# Patient Record
Sex: Male | Born: 1935 | Race: White | Hispanic: No | Marital: Married | Smoking: Never smoker
Health system: Southern US, Community
[De-identification: ages and names within clinical notes are randomized; demographics above are authoritative.]

## PROBLEM LIST (undated history)

## (undated) DIAGNOSIS — I509 Heart failure, unspecified: Secondary | ICD-10-CM

## (undated) DIAGNOSIS — I4891 Unspecified atrial fibrillation: Secondary | ICD-10-CM

## (undated) DIAGNOSIS — E119 Type 2 diabetes mellitus without complications: Secondary | ICD-10-CM

## (undated) HISTORY — PX: KNEE ARTHROSCOPY: SUR90

---

## 2005-01-28 ENCOUNTER — Ambulatory Visit: Payer: Self-pay | Admitting: Internal Medicine

## 2005-01-28 ENCOUNTER — Ambulatory Visit (HOSPITAL_COMMUNITY): Admission: RE | Admit: 2005-01-28 | Discharge: 2005-01-28 | Payer: Self-pay | Admitting: Internal Medicine

## 2007-03-06 ENCOUNTER — Encounter: Admission: RE | Admit: 2007-03-06 | Discharge: 2007-03-06 | Payer: Self-pay | Admitting: *Deleted

## 2007-03-17 ENCOUNTER — Ambulatory Visit (HOSPITAL_COMMUNITY): Admission: RE | Admit: 2007-03-17 | Discharge: 2007-03-17 | Payer: Self-pay | Admitting: *Deleted

## 2010-09-15 NOTE — Cardiovascular Report (Signed)
Ross Sweeney, BACHTEL NO.:  [id number]   MEDICAL RECORD NO.:  [id number]          PATIENT TYPE:  OIB   LOCATION:  2852                         FACILITY:  [facility]   PHYSICIAN:  Darlin Priestly, MD  DATE OF BIRTH:  Oct 29, 1935   DATE OF PROCEDURE:  DATE OF DISCHARGE:                            CARDIAC CATHETERIZATION   PROCEDURES:  1. Left heart catheterization.  2. Coronary angiography.  3. Left ventriculogram.  4. Abdominal aortogram.   COMPLICATIONS:  None.   INDICATIONS:  Mr. Schaller is a 75 year old male patient of Dr. Delbert Harness  with a history of atrial fibrillation dating back to July 2006 as well  as moderate mitral regurgitation.  He did undergo a Cardiolite scan  suggesting inferolateral wall ischemia with an EF of 45%.  He has had no  significant chest pain.  After discussing with his wife, we have elected  to proceed with cardiac catheterization to rule out the possibility of  CAD given his underlying atrial fibrillation.   DESCRIPTION:  After informed consent, the patient was taken to the  cardiac cath lab.  The right groin was shaved, prepped and draped in the  sterile fashion.  ECG monitoring was established. Using modified  Seldinger, number 6 French arterial sheath  was inserted in the right  femoral artery.  A 6-French diagnostic catheter and performed diagnostic  angiography.   OPINION:  1. Left main is a large vessel with no significant disease.  2. LAD is a large vessel coursing through the apex giving rise to two      diagonal branch.  The LAD is coarse and irregular throughout its      proximal and early mid segment up to 40% stenosis.  3. The first diagonal is a medium-sized vessel with no significant      disease.  4. The second diagonal is a small vessel with no significant disease.  5. Left circumflex is a medium-size vessel coursing to the AV groove      and gives three obtuse marginal branches.  The AV circumflex has      40%  midvessel narrowing.  6. First OM is a mid vessel which bifurcates distally with a 40 %      midvessel lesion.  7. Second OM is a small vessel with 99% ostial lesion.  The third OM      is a small to medium size vessel with diffuse 60% narrowing      throughout.  8. The right coronary artery is a large vessel which is dominant and      gives rise to the PDA as well as the posterolateral branch.  There      is no significant disease in the RCA, PDA or posterolateral branch.  9. Left ventriculogram reveals a mildly depressed EF of 40-45% with      mild global hypokinesis.  There does appear to be moderate mitral      regurgitation, though  there is significant amount of ectopy noted.  10.Abdominal aortogram reveals no evidence of significant renal artery  stenosis or aneurysm.   HEMODYNAMIC SYSTEM:  Right arterial pressure 102/70, LV systemic  pressure 102/10, LVEDP of 22.   CONCLUSION:  1. Scattered small vessel disease best treated medically.  2. Mildly depressed left ventricular systolic function.  3. Moderate mitral regurgitation.  4. No evidence of renal artery stenosis.  5. Elevated left ventricular end-diastolic pressure.      Darlin Priestly, MD  Electronically Signed     RHM/MEDQ  D:  03/17/2007  T:  03/17/2007  Job:  [id number]   cc:   Delbert Harness, MD

## 2010-09-18 NOTE — Op Note (Signed)
Ross Sweeney, Ross Sweeney                 ACCOUNT NO.:  [id number]   MEDICAL RECORD NO.:  [id number]          PATIENT TYPE:  AMB   LOCATION:  DAY                           FACILITY:  [facility]   PHYSICIAN:  R. Roetta Sessions, M.D. DATE OF BIRTH:  06/03/1935   DATE OF PROCEDURE:  01/28/2005  DATE OF DISCHARGE:                                 OPERATIVE REPORT   PROCEDURE:  Screening colonoscopy.   ENDOSCOPIST:  Jonathon Bellows, M.D.   INDICATION FOR PROCEDURE:  This is a 75 year old gentleman referred by Dr.  Oval Linsey for colorectal cancer screening.  He has no lower GI tract  symptoms.  There is no family history of colorectal neoplasia.  He has never  had his lower GI tract imaged.  Consequently, screening colonoscopy is now  being done.  This approach has been discussed with the patient at length.  Potential risks, benefits, and alternatives have been reviewed and questions  answered and he is agreeable.  Please see documentation in the medical  record.   PROCEDURE NOTE:  O2 saturation, blood pressure, pulse, respirations were  monitored throughout the entire procedure.   CONSCIOUS SEDATION:  Versed 2 mg IV, Demerol 50 mg IV in a single dose.   INSTRUMENT:  Olympus video chip system.   FINDINGS:  Digital rectal exam revealed no abnormalities.   ENDOSCOPIC FINDINGS:  Prep was good. Rectal:  Examination of rectal mucosa  including retroflexion view of the anal verge revealed no abnormalities.  The remaining colon and colonic mucosa was surveyed from the rectosigmoid  junction to the left transverse and right colon, appendiceal orifice,  ileocecal valve, and cecum.  These structures were well seen and  photographed for the record.  The scope was slowly withdrawn and all  previously viewed mucosal areas were again seen.  The colonic mucosa  appeared normal except for left-sided diverticulum.  The patient tolerated  the procedure well and was reactive after endoscopy.   IMPRESSION:   Normal rectum, left-sided diverticula.  The remainder of  colonic mucosa appeared normal.  It is notable that the relatively poor prep  on the right side did make the exam more difficult in that segment of the  colon.   RESULTS:  1.  Diverticulosis literature provided to [name].  2.  Repeat screening colonoscopy in 10 years.      Jonathon Bellows, M.D.  Electronically Signed     RMR/MEDQ  D:  01/28/2005  T:  01/28/2005  Job:  [id number]   cc:   Melvyn Novas, MD  Fax: [phone]

## 2011-02-09 LAB — PROTIME-INR: Prothrombin Time: 14.6

## 2011-02-09 LAB — CBC
HCT: 43.7
MCV: 96.8
Platelets: 270
RBC: 4.52
WBC: 10.4

## 2014-09-04 ENCOUNTER — Ambulatory Visit (HOSPITAL_COMMUNITY)
Admission: RE | Admit: 2014-09-04 | Discharge: 2014-09-04 | Disposition: A | Discharge status: Home / Self Care | Payer: Medicare Other | Source: Ambulatory Visit | Attending: Family Medicine | Admitting: Family Medicine

## 2014-09-04 ENCOUNTER — Other Ambulatory Visit (HOSPITAL_COMMUNITY): Payer: Self-pay | Admitting: Family Medicine

## 2014-09-04 DIAGNOSIS — R52 Pain, unspecified: Secondary | ICD-10-CM

## 2014-09-04 DIAGNOSIS — M25561 Pain in right knee: Secondary | ICD-10-CM | POA: Insufficient documentation

## 2014-09-04 DIAGNOSIS — M1711 Unilateral primary osteoarthritis, right knee: Secondary | ICD-10-CM | POA: Diagnosis not present

## 2018-04-17 ENCOUNTER — Emergency Department (HOSPITAL_COMMUNITY): Payer: Medicare Other

## 2018-04-17 ENCOUNTER — Other Ambulatory Visit: Payer: Self-pay

## 2018-04-17 ENCOUNTER — Encounter (HOSPITAL_COMMUNITY): Payer: Self-pay | Admitting: Emergency Medicine

## 2018-04-17 ENCOUNTER — Inpatient Hospital Stay (HOSPITAL_COMMUNITY)
Admission: EM | Admit: 2018-04-17 | Discharge: 2018-05-03 | DRG: 291 | Disposition: E | Payer: Medicare Other | Attending: Internal Medicine | Admitting: Internal Medicine

## 2018-04-17 DIAGNOSIS — E1122 Type 2 diabetes mellitus with diabetic chronic kidney disease: Secondary | ICD-10-CM | POA: Diagnosis present

## 2018-04-17 DIAGNOSIS — I472 Ventricular tachycardia: Secondary | ICD-10-CM | POA: Diagnosis present

## 2018-04-17 DIAGNOSIS — Z7901 Long term (current) use of anticoagulants: Secondary | ICD-10-CM

## 2018-04-17 DIAGNOSIS — R001 Bradycardia, unspecified: Secondary | ICD-10-CM | POA: Diagnosis not present

## 2018-04-17 DIAGNOSIS — I5023 Acute on chronic systolic (congestive) heart failure: Secondary | ICD-10-CM | POA: Diagnosis present

## 2018-04-17 DIAGNOSIS — I700 Atherosclerosis of aorta: Secondary | ICD-10-CM | POA: Diagnosis present

## 2018-04-17 DIAGNOSIS — Z66 Do not resuscitate: Secondary | ICD-10-CM | POA: Diagnosis present

## 2018-04-17 DIAGNOSIS — I4811 Longstanding persistent atrial fibrillation: Secondary | ICD-10-CM

## 2018-04-17 DIAGNOSIS — R571 Hypovolemic shock: Secondary | ICD-10-CM | POA: Diagnosis not present

## 2018-04-17 DIAGNOSIS — I2510 Atherosclerotic heart disease of native coronary artery without angina pectoris: Secondary | ICD-10-CM | POA: Diagnosis present

## 2018-04-17 DIAGNOSIS — I429 Cardiomyopathy, unspecified: Secondary | ICD-10-CM | POA: Diagnosis present

## 2018-04-17 DIAGNOSIS — I951 Orthostatic hypotension: Secondary | ICD-10-CM | POA: Diagnosis not present

## 2018-04-17 DIAGNOSIS — K661 Hemoperitoneum: Secondary | ICD-10-CM | POA: Diagnosis present

## 2018-04-17 DIAGNOSIS — I77810 Thoracic aortic ectasia: Secondary | ICD-10-CM | POA: Diagnosis present

## 2018-04-17 DIAGNOSIS — Z8249 Family history of ischemic heart disease and other diseases of the circulatory system: Secondary | ICD-10-CM

## 2018-04-17 DIAGNOSIS — I361 Nonrheumatic tricuspid (valve) insufficiency: Secondary | ICD-10-CM | POA: Diagnosis not present

## 2018-04-17 DIAGNOSIS — R400 Somnolence: Secondary | ICD-10-CM | POA: Diagnosis not present

## 2018-04-17 DIAGNOSIS — I509 Heart failure, unspecified: Secondary | ICD-10-CM | POA: Diagnosis present

## 2018-04-17 DIAGNOSIS — I272 Pulmonary hypertension, unspecified: Secondary | ICD-10-CM | POA: Diagnosis present

## 2018-04-17 DIAGNOSIS — Z832 Family history of diseases of the blood and blood-forming organs and certain disorders involving the immune mechanism: Secondary | ICD-10-CM

## 2018-04-17 DIAGNOSIS — I4891 Unspecified atrial fibrillation: Secondary | ICD-10-CM | POA: Diagnosis present

## 2018-04-17 DIAGNOSIS — M5136 Other intervertebral disc degeneration, lumbar region: Secondary | ICD-10-CM | POA: Diagnosis present

## 2018-04-17 DIAGNOSIS — I081 Rheumatic disorders of both mitral and tricuspid valves: Secondary | ICD-10-CM | POA: Diagnosis present

## 2018-04-17 DIAGNOSIS — N179 Acute kidney failure, unspecified: Secondary | ICD-10-CM | POA: Diagnosis present

## 2018-04-17 DIAGNOSIS — N281 Cyst of kidney, acquired: Secondary | ICD-10-CM | POA: Diagnosis present

## 2018-04-17 DIAGNOSIS — E119 Type 2 diabetes mellitus without complications: Secondary | ICD-10-CM | POA: Diagnosis not present

## 2018-04-17 DIAGNOSIS — I482 Chronic atrial fibrillation, unspecified: Secondary | ICD-10-CM | POA: Diagnosis present

## 2018-04-17 DIAGNOSIS — I340 Nonrheumatic mitral (valve) insufficiency: Secondary | ICD-10-CM | POA: Diagnosis not present

## 2018-04-17 DIAGNOSIS — W19XXXA Unspecified fall, initial encounter: Secondary | ICD-10-CM

## 2018-04-17 DIAGNOSIS — Z7984 Long term (current) use of oral hypoglycemic drugs: Secondary | ICD-10-CM

## 2018-04-17 DIAGNOSIS — E785 Hyperlipidemia, unspecified: Secondary | ICD-10-CM | POA: Diagnosis present

## 2018-04-17 DIAGNOSIS — R17 Unspecified jaundice: Secondary | ICD-10-CM | POA: Diagnosis present

## 2018-04-17 DIAGNOSIS — R58 Hemorrhage, not elsewhere classified: Secondary | ICD-10-CM | POA: Diagnosis not present

## 2018-04-17 DIAGNOSIS — M7730 Calcaneal spur, unspecified foot: Secondary | ICD-10-CM | POA: Diagnosis present

## 2018-04-17 DIAGNOSIS — I5041 Acute combined systolic (congestive) and diastolic (congestive) heart failure: Secondary | ICD-10-CM

## 2018-04-17 DIAGNOSIS — R791 Abnormal coagulation profile: Secondary | ICD-10-CM | POA: Diagnosis present

## 2018-04-17 DIAGNOSIS — N183 Chronic kidney disease, stage 3 unspecified: Secondary | ICD-10-CM | POA: Diagnosis present

## 2018-04-17 DIAGNOSIS — I5043 Acute on chronic combined systolic (congestive) and diastolic (congestive) heart failure: Secondary | ICD-10-CM | POA: Diagnosis present

## 2018-04-17 DIAGNOSIS — E875 Hyperkalemia: Secondary | ICD-10-CM | POA: Diagnosis not present

## 2018-04-17 DIAGNOSIS — I469 Cardiac arrest, cause unspecified: Secondary | ICD-10-CM | POA: Diagnosis present

## 2018-04-17 DIAGNOSIS — J320 Chronic maxillary sinusitis: Secondary | ICD-10-CM | POA: Diagnosis present

## 2018-04-17 DIAGNOSIS — Z79899 Other long term (current) drug therapy: Secondary | ICD-10-CM

## 2018-04-17 DIAGNOSIS — I370 Nonrheumatic pulmonary valve stenosis: Secondary | ICD-10-CM | POA: Diagnosis not present

## 2018-04-17 DIAGNOSIS — I351 Nonrheumatic aortic (valve) insufficiency: Secondary | ICD-10-CM | POA: Diagnosis not present

## 2018-04-17 DIAGNOSIS — T502X5A Adverse effect of carbonic-anhydrase inhibitors, benzothiadiazides and other diuretics, initial encounter: Secondary | ICD-10-CM | POA: Diagnosis present

## 2018-04-17 DIAGNOSIS — K5730 Diverticulosis of large intestine without perforation or abscess without bleeding: Secondary | ICD-10-CM | POA: Diagnosis present

## 2018-04-17 HISTORY — DX: Type 2 diabetes mellitus without complications: E11.9

## 2018-04-17 HISTORY — DX: Heart failure, unspecified: I50.9

## 2018-04-17 HISTORY — DX: Unspecified atrial fibrillation: I48.91

## 2018-04-17 LAB — BRAIN NATRIURETIC PEPTIDE: B Natriuretic Peptide: 1380.5 pg/mL — ABNORMAL HIGH (ref 0.0–100.0)

## 2018-04-17 LAB — I-STAT TROPONIN, ED: Troponin i, poc: 0.02 ng/mL (ref 0.00–0.08)

## 2018-04-17 LAB — PROTIME-INR
INR: 2.35
Prothrombin Time: 25.4 seconds — ABNORMAL HIGH (ref 11.4–15.2)

## 2018-04-17 LAB — BASIC METABOLIC PANEL
ANION GAP: 10 (ref 5–15)
BUN: 49 mg/dL — ABNORMAL HIGH (ref 8–23)
CHLORIDE: 111 mmol/L (ref 98–111)
CO2: 19 mmol/L — AB (ref 22–32)
Calcium: 8.9 mg/dL (ref 8.9–10.3)
Creatinine, Ser: 1.87 mg/dL — ABNORMAL HIGH (ref 0.61–1.24)
GFR calc non Af Amer: 33 mL/min — ABNORMAL LOW (ref >60)
GFR, EST AFRICAN AMERICAN: 38 mL/min — AB (ref >60)
GLUCOSE: 111 mg/dL — AB (ref 70–99)
Potassium: 5.3 mmol/L — ABNORMAL HIGH (ref 3.5–5.1)
Sodium: 140 mmol/L (ref 135–145)

## 2018-04-17 LAB — CBC
HEMATOCRIT: 41.1 % (ref 39.0–52.0)
Hemoglobin: 12.5 g/dL — ABNORMAL LOW (ref 13.0–17.0)
MCH: 29.7 pg (ref 26.0–34.0)
MCHC: 30.4 g/dL (ref 30.0–36.0)
MCV: 97.6 fL (ref 80.0–100.0)
NRBC: 0 % (ref 0.0–0.2)
Platelets: 127 10*3/uL — ABNORMAL LOW (ref 150–400)
RBC: 4.21 MIL/uL — AB (ref 4.22–5.81)
RDW: 15.4 % (ref 11.5–15.5)
WBC: 8.8 10*3/uL (ref 4.0–10.5)

## 2018-04-17 LAB — HEMOGLOBIN A1C
Hgb A1c MFr Bld: 6.1 % — ABNORMAL HIGH (ref 4.8–5.6)
Mean Plasma Glucose: 128.37 mg/dL

## 2018-04-17 LAB — GLUCOSE, CAPILLARY: Glucose-Capillary: 135 mg/dL — ABNORMAL HIGH (ref 70–99)

## 2018-04-17 MED ORDER — DIGOXIN 0.25 MG/ML IJ SOLN
0.2500 mg | Freq: Once | INTRAMUSCULAR | Status: AC
  Administered 2018-04-17: 0.25 mg via INTRAVENOUS
  Filled 2018-04-17: qty 2

## 2018-04-17 MED ORDER — ROSUVASTATIN CALCIUM 20 MG PO TABS
20.0000 mg | ORAL_TABLET | Freq: Every day | ORAL | Status: DC
  Administered 2018-04-17 – 2018-04-23 (×7): 20 mg via ORAL
  Filled 2018-04-17 (×7): qty 1

## 2018-04-17 MED ORDER — DIGOXIN 0.25 MG/ML IJ SOLN
0.1250 mg | Freq: Once | INTRAMUSCULAR | Status: AC
  Administered 2018-04-18: 0.125 mg via INTRAVENOUS
  Filled 2018-04-17: qty 2

## 2018-04-17 MED ORDER — ACETAMINOPHEN 325 MG PO TABS
650.0000 mg | ORAL_TABLET | ORAL | Status: DC | PRN
  Administered 2018-04-23: 650 mg via ORAL
  Filled 2018-04-17: qty 2

## 2018-04-17 MED ORDER — FUROSEMIDE 10 MG/ML IJ SOLN
40.0000 mg | Freq: Two times a day (BID) | INTRAMUSCULAR | Status: DC
  Administered 2018-04-17 – 2018-04-22 (×11): 40 mg via INTRAVENOUS
  Filled 2018-04-17 (×12): qty 4

## 2018-04-17 MED ORDER — METOPROLOL TARTRATE 100 MG PO TABS
100.0000 mg | ORAL_TABLET | Freq: Two times a day (BID) | ORAL | Status: DC
  Administered 2018-04-17 – 2018-04-18 (×3): 100 mg via ORAL
  Filled 2018-04-17: qty 4
  Filled 2018-04-17 (×2): qty 1

## 2018-04-17 MED ORDER — SODIUM CHLORIDE 0.9% FLUSH
3.0000 mL | Freq: Two times a day (BID) | INTRAVENOUS | Status: DC
  Administered 2018-04-17 – 2018-04-23 (×8): 3 mL via INTRAVENOUS

## 2018-04-17 MED ORDER — SODIUM CHLORIDE 0.9 % IV SOLN
250.0000 mL | INTRAVENOUS | Status: DC | PRN
  Administered 2018-04-19: 250 mL via INTRAVENOUS

## 2018-04-17 MED ORDER — ONDANSETRON HCL 4 MG/2ML IJ SOLN: 4.0000 mg | Freq: Four times a day (QID) | INTRAMUSCULAR | Status: DC | PRN

## 2018-04-17 MED ORDER — SODIUM CHLORIDE 0.9% FLUSH
3.0000 mL | INTRAVENOUS | Status: DC | PRN
  Administered 2018-04-19: 3 mL via INTRAVENOUS
  Filled 2018-04-17: qty 3

## 2018-04-17 MED ORDER — ASPIRIN EC 81 MG PO TBEC
81.0000 mg | DELAYED_RELEASE_TABLET | Freq: Every day | ORAL | Status: DC
  Administered 2018-04-18 – 2018-04-22 (×5): 81 mg via ORAL
  Filled 2018-04-17 (×6): qty 1

## 2018-04-17 MED ORDER — DIGOXIN 125 MCG PO TABS
0.0625 mg | ORAL_TABLET | Freq: Every day | ORAL | Status: DC
  Administered 2018-04-18 – 2018-04-19 (×2): 0.0625 mg via ORAL
  Filled 2018-04-17 (×2): qty 1

## 2018-04-17 NOTE — Consult Note (Signed)
Cardiology Consult:   Patient ID: Ross Sweeney MRN: [id number]; DOB: 02-04-1936   Admission date: 2018/04/18  Primary Care Provider: Oval Linsey, MD Primary Cardiologist: Roosvelt Maser Primary Electrophysiologist:  None   Chief Complaint:  Worsening LE edema Consulted by Dr. Ophelia Charter  Patient Profile:   Ross Sweeney is a 82 y.o. male with chronic atrial fibrillation, DM II and CHF who was referred to [facility] for evaluation of worsening LE edema and abnormal echo  History of Present Illness:   Ross Sweeney is a 82 year old male with past medical history of chronic atrial fibrillation, HLD, DM 2 and CHF.  Patient has history of atrial fibrillation dating back to July 2006 as well as moderate mitral regurgitation.  He was previously followed by Dr. Lenise Herald.  He had a Myoview which showed inferolateral wall ischemia with EF of 45%, left this led to a cardiac catheterization on 03/17/2007 which showed scattered small vessel disease with 40% mid LAD lesion, 40% mid left circumflex lesion, 40% first OM lesion, 99% second OM lesion, 60% third OM lesion, EF 40 to 45%.  He has not been followed by cardiology service since.  He is on Coumadin therapy and this has been managed by his primary care provider.  According to patient, his last echocardiogram was several years ago which showed stable pumping function.  For the past month, he has been noticing worsening dyspnea on exertion and lower extremity edema.  With strenuous activity, he also noticed some substernal chest discomfort as well.  He is not on any fluid pill at home.  New echocardiogram was obtained on 04-18-18, this showed EF 21%, global hypokinesis, moderately enlarged left atrium, moderately dilated RV, 3+ mitral and tricuspid regurgitation.  He was referred to [facility] for [facility] evaluation.  On arrival, he was noted to be in atrial fibrillation with RVR, heart rate of 130.  According to the patient, this is actually his  normal heart rate at home.  On physical exam, he also have significant diminished breath sound in the right base of the lung.  He has 3+ pitting edema on exam as well.  Cardiology was consulted for acute CHF in the setting of atrial fibrillation with RVR and worsening systolic EF.   Home medication: Coumadin 5 mg daily Lopressor 100 mg twice daily Crestor 20 mg daily Farxiga 10 mg Metformin 500 mg twice daily Vitamin D3 50,000 Testosterone gel   Past Medical History:  Diagnosis Date  . A-fib (HCC)    on Coumadin  . CHF (congestive heart failure) (HCC)   . Diabetes mellitus without complication [facility])     Past Surgical History:  Procedure Laterality Date  . KNEE ARTHROSCOPY Right      Medications Prior to Admission: Prior to Admission medications   Medication Sig Start Date End Date Taking? Authorizing Provider  dapagliflozin propanediol (FARXIGA) 10 MG TABS tablet Take 10 mg by mouth daily.   Yes [provider]  Glucos-Chondroit-Hyaluron-MSM (GLUCOSAMINE CHONDROITIN JOINT PO) Take 1,000 mg by mouth 2 (two) times daily.   Yes [provider]  metFORMIN (GLUCOPHAGE) 500 MG tablet Take by mouth 2 (two) times daily with a meal.   Yes [provider]  metoprolol tartrate (LOPRESSOR) 100 MG tablet Take 100 mg by mouth 2 (two) times daily.   Yes [provider]  rosuvastatin (CRESTOR) 20 MG tablet Take 20 mg by mouth daily.   Yes [provider]  VITAMIN D, CHOLECALCIFEROL, PO Take 5,000 Units by  mouth daily.   Yes [provider]  warfarin (COUMADIN) 5 MG tablet Take 5 mg by mouth daily at 6 PM.   Yes [provider]     Allergies:   Not on File  Social History:   Social History   Socioeconomic History  . Marital status: Married    Spouse name: Not on file  . Number of children: Not on file  . Years of education: Not on file  . Highest education level: Not on file  Occupational History  . Occupation: retired    Engineer, production  . Financial resource strain: Not on file  . Food insecurity:    Worry: Not on file    Inability: Not on file  . Transportation needs:    Medical: Not on file    Non-medical: Not on file  Tobacco Use  . Smoking status: Never Smoker  . Smokeless tobacco: Never Used  Substance and Sexual Activity  . Alcohol use: Not Currently    Frequency: Never  . Drug use: Never  . Sexual activity: Not on file  Lifestyle  . Physical activity:    Days per week: Not on file    Minutes per session: Not on file  . Stress: Not on file  Relationships  . Social connections:    Talks on phone: Not on file    Gets together: Not on file    Attends religious service: Not on file    Active member of club or organization: Not on file    Attends meetings of clubs or organizations: Not on file    Relationship status: Not on file  . Intimate partner violence:    Fear of current or ex partner: Not on file    Emotionally abused: Not on file    Physically abused: Not on file    Forced sexual activity: Not on file  Other Topics Concern  . Not on file  Social History Narrative  . Not on file    Family History:   The patient's family history includes Cancer (age of onset: 61) in his mother; Heart failure (age of onset: 43) in his father; Lupus in his mother.    ROS:  Please see the history of present illness.  All other ROS reviewed and negative.     Physical Exam/Data:   Vitals:   2018/05/01 1415 01-May-2018 1430 2018/05/01 1500 01-May-2018 1515  BP: 110/79 112/73 108/83 106/81  Pulse: (!) 102 85  95  Resp: 15 15 (!) 22 16  SpO2: 97% 97%  97%  Weight:       No intake or output data in the 24 hours ending 05-01-2018 1540 Filed Weights   2018-05-01 1205  Weight: 88.9 kg   There is no height or weight on file to calculate BMI.  General:  Well nourished, well developed, in no acute distress HEENT: normal Lymph: no adenopathy Neck: no JVD Endocrine:  No thryomegaly Vascular: No carotid  bruits; FA pulses 2+ bilaterally without bruits  Cardiac:  Tachycardic, irregular; no murmur  Lungs:  Decreased breath sound in the R base  Abd: soft, nontender, no hepatomegaly  Ext: 3+ pitting edema Musculoskeletal:  No deformities, BUE and BLE strength normal and equal Skin: warm and dry  Neuro:  CNs 2-12 intact, no focal abnormalities noted Psych:  Normal affect    EKG:  The ECG that was done in ED and was personally reviewed and demonstrates Atrial fibrillation with RVR  Relevant CV Studies:  PROCEDURES:  1. Left heart catheterization.  2. Coronary angiography.  3. Left ventriculogram.  4. Abdominal aortogram.   COMPLICATIONS:  None.   INDICATIONS:  Mr. Crean is a 82 year old male patient of Dr. Delbert Harness  with a history of atrial fibrillation dating back to July 2006 as well  as moderate mitral regurgitation.  He did undergo a Cardiolite scan  suggesting inferolateral wall ischemia with an EF of 45%.  He has had no  significant chest pain.  After discussing with his wife, we have elected  to proceed with cardiac catheterization to rule out the possibility of  CAD given his underlying atrial fibrillation.   DESCRIPTION:  After informed consent, the patient was taken to the  cardiac cath lab.  The right groin was shaved, prepped and draped in the  sterile fashion.  ECG monitoring was established. Using modified  Seldinger, number 6 French arterial sheath  was inserted in the right  femoral artery.  A 6-French diagnostic catheter and performed diagnostic  angiography.   OPINION:  1. Left main is a large vessel with no significant disease.  2. LAD is a large vessel coursing through the apex giving rise to two      diagonal branch.  The LAD is coarse and irregular throughout its      proximal and early mid segment up to 40% stenosis.  3. The first diagonal is a medium-sized vessel with no significant      disease.  4. The second diagonal is a small vessel with no  significant disease.  5. Left circumflex is a medium-size vessel coursing to the AV groove      and gives three obtuse marginal branches.  The AV circumflex has      40% midvessel narrowing.  6. First OM is a mid vessel which bifurcates distally with a 40 %      midvessel lesion.  7. Second OM is a small vessel with 99% ostial lesion.  The third OM      is a small to medium size vessel with diffuse 60% narrowing      throughout.  8. The right coronary artery is a large vessel which is dominant and      gives rise to the PDA as well as the posterolateral branch.  There      is no significant disease in the RCA, PDA or posterolateral branch.  9. Left ventriculogram reveals a mildly depressed EF of 40-45% with      mild global hypokinesis.  There does appear to be moderate mitral      regurgitation, though  there is significant amount of ectopy noted.  10.Abdominal aortogram reveals no evidence of significant renal artery      stenosis or aneurysm.   HEMODYNAMIC SYSTEM:  Right arterial pressure 102/70, LV systemic  pressure 102/10, LVEDP of 22.   CONCLUSION:  1. Scattered small vessel disease best treated medically.  2. Mildly depressed left ventricular systolic function.  3. Moderate mitral regurgitation.  4. No evidence of renal artery stenosis.  5. Elevated left ventricular end-diastolic pressure.  Laboratory Data:  Chemistry Recent Labs  Lab 05-16-18 1245  NA 140  K 5.3*  CL 111  CO2 19*  GLUCOSE 111*  BUN 49*  CREATININE 1.87*  CALCIUM 8.9  GFRNONAA 33*  GFRAA 38*  ANIONGAP 10    No results for input(s): PROT, ALBUMIN, AST, ALT, ALKPHOS, BILITOT in the last 168 hours. Hematology Recent Labs  Lab 16-May-2018 1245  WBC 8.8  RBC 4.21*  HGB 12.5*  HCT 41.1  MCV 97.6  MCH 29.7  MCHC 30.4  RDW 15.4  PLT 127*   Cardiac EnzymesNo results for input(s): TROPONINI in the last 168 hours.  Recent Labs  Lab 06-23-17 1256  TROPIPOC 0.02    BNPNo results for  input(s): BNP, PROBNP in the last 168 hours.  DDimer No results for input(s): DDIMER in the last 168 hours.  Radiology/Studies:  Dg Chest Port 1 View  Result Date: 10-03-2017 CLINICAL DATA:  History of atrial fibrillation.  Abnormal echo. EXAM: PORTABLE CHEST 1 VIEW COMPARISON:  March 06, 2007 FINDINGS: The cardiac silhouette appears enlarged although is poorly evaluated due to portable technique. No pneumothorax. The lungs are clear. No other acute abnormalities. IMPRESSION: The cardiac silhouette appears enlarged but is poorly evaluated due to portable technique. No other acute abnormalities. Electronically Signed   By: Gerome Samavid  Williams III M.D   On: 10-03-2017 12:33    Assessment and Plan:   1. Acute on chronic systolic heart failure:  - will discuss with MD, given the degree of cardiomyopathy, IV diuresis with 40mg  BID IV lasix during the time being, transition Coumadin to IV heparin, once fully diuresed consider left and right heart cath.  -Most likely reason for drop in the ejection fraction is tachycardia mediated cardiomyopathy due to uncontrolled heart rate with atrial fibrillation, however with her previous coronary artery disease and worsening EF, will need to rule out underlying progression of coronary artery disease.  - titrate CHF medication, convert lopressor to Toprol XL later once HR better controlled. Add Entresto prior to D/C.   2. Atrial fibrillation with RVR: Despite the patient is on Lopressor 100 mg twice daily, his heart rate is very much uncontrolled with heart rate of 130.  Surprisingly, according to the patient, this is normal heart rate for him at home.  This likely has resulted in the worsening ejection fraction and acute CHF exacerbation.  - start on digoxin for better rate control. Continue lopressor, consider switch to Toprol XL later  3. CAD: Last cardiac catheterization was in 2008, cath report above, mild to moderate disease in large vessel, mainly small vessel  disease in the obtuse marginal branch.  4. DM 2: Last hemoglobin A1c obtained in July 2019 was 6.5  5. HLD: Recent fasting lipid panel obtained in July 2019 showed a total cholesterol 113, HDL 36, triglyceride 113, LDL 57.  6. Renal insufficiency of unknown duration    For questions or updates, please contact [facility] Please consult [email or web address] for contact info under        Ramond DialSigned, Karmah Potocki, [place]  10-03-2017 3:40 PM

## 2018-04-17 NOTE — H&P (Signed)
History and Physical    Ross Sweeney ZOX:[id number] DOB: 11/12/1935 DOA: 02-20-18  PCP: Oval Linseyondiego, [name], MD Consultants:  None Patient coming from:  Home - lives alone; NOK: Daughter, [phone]  Chief Complaint:  Abnormal test  HPI: Ross Sweeney is a 82 y.o. male with medical history significant of DM; afib; and CHF presenting after being sent by PCP. Dr. Janna Archondiego did an echo this AM and said since the last one - 8-9 years ago - something caused the heart muscle to weaken and so he sent him in.  He has afib and it "runs wide open all the time."  The patinet isn't sure why he did the echo, but he does have DOE and LE edema.  He can not walk through his house without feeling SOB.  Symptoms have been going on "this year."  Occasional nonproductive cough.  +wheezing, occasional, most days.  +LE edema x 3-4 weeks.  No weight gain since the edema started but his weight is up prior.  He had been started "on a pill to take it off but I told him this morning it wasn't taking it off."  His weight this AM was 196, usual weight 170-185.  No orthopnea.  No PND.  He wakes up thirsty.  He only feels SOB with ambulation.  ED Course:  Sent by Dr. Delbert Harnesson [place] - DOE and LE edema.  Has h/o afib, given home Metoprolol dose.  Also on Coumadin.  EF previously ? And now 21%.  Now with hypokinesis of L ventricle, concern for needing a cath.  Cards to see.  Review of Systems: As per HPI; otherwise review of systems reviewed and negative.   Ambulatory Status:  Ambulates without assistance  Past Medical History:  Diagnosis Date  . A-fib (HCC)    on Coumadin  . CHF (congestive heart failure) (HCC)   . Diabetes mellitus without complication [facility](HCC)     Past Surgical History:  Procedure Laterality Date  . KNEE ARTHROSCOPY Right     Social History   Socioeconomic History  . Marital status: Married    Spouse name: Not on file  . Number of children: Not on file  . Years of education: Not on file  . Highest  education level: Not on file  Occupational History  . Occupation: retired  Engineer, productionocial Needs  . Financial resource strain: Not on file  . Food insecurity:    Worry: Not on file    Inability: Not on file  . Transportation needs:    Medical: Not on file    Non-medical: Not on file  Tobacco Use  . Smoking status: Never Smoker  . Smokeless tobacco: Never Used  Substance and Sexual Activity  . Alcohol use: Not Currently    Frequency: Never  . Drug use: Never  . Sexual activity: Not on file  Lifestyle  . Physical activity:    Days per week: Not on file    Minutes per session: Not on file  . Stress: Not on file  Relationships  . Social connections:    Talks on phone: Not on file    Gets together: Not on file    Attends religious service: Not on file    Active member of club or organization: Not on file    Attends meetings of clubs or organizations: Not on file    Relationship status: Not on file  . Intimate partner violence:    Fear of current or ex partner: Not on file  Emotionally abused: Not on file    Physically abused: Not on file    Forced sexual activity: Not on file  Other Topics Concern  . Not on file  Social History Narrative  . Not on file    Not on File  Family History  Problem Relation Age of Onset  . Cancer Mother 59  . Lupus Mother   . Heart failure Father 10    Prior to Admission medications   Medication Sig Start Date End Date Taking? Authorizing Provider  dapagliflozin propanediol (FARXIGA) 10 MG TABS tablet Take 10 mg by mouth daily.   Yes [provider]  Glucos-Chondroit-Hyaluron-MSM (GLUCOSAMINE CHONDROITIN JOINT PO) Take 1,000 mg by mouth 2 (two) times daily.   Yes [provider]  metFORMIN (GLUCOPHAGE) 500 MG tablet Take by mouth 2 (two) times daily with a meal.   Yes [provider]  metoprolol tartrate (LOPRESSOR) 100 MG tablet Take 100 mg by mouth 2 (two) times daily.   Yes [provider]  rosuvastatin  (CRESTOR) 20 MG tablet Take 20 mg by mouth daily.   Yes [provider]  VITAMIN D, CHOLECALCIFEROL, PO Take 5,000 Units by mouth daily.   Yes [provider]  warfarin (COUMADIN) 5 MG tablet Take 5 mg by mouth daily at 6 PM.   Yes [provider]    Physical Exam: Vitals:   05-12-18 1430 2018-05-12 1500 May 12, 2018 1515 05-12-18 1530  BP: 112/73 108/83 106/81 111/79  Pulse: 85  95 (!) 52  Resp: 15 (!) 22 16 (!) 22  SpO2: 97%  97% 96%  Weight:         General:  Appears calm and comfortable and is NAD Eyes:  PERRL, EOMI, normal lids, iris ENT:  grossly normal hearing, lips & tongue, mmm Neck:  no LAD, masses or thyromegaly Cardiovascular:  Irregularly irregular, not particularly well rate controlled, no m/r/g. 3+ pitting LE edema extending up to the thighs.  Respiratory:   CTA bilaterally with no wheezes/rales/rhonchi.  Normal respiratory effort. Abdomen:  soft, NT, ND, NABS Skin:  no rash or induration seen on limited exam Musculoskeletal:  grossly normal tone BUE/BLE, good ROM, no bony abnormality Psychiatric:  grossly normal mood and affect, speech fluent and appropriate, AOx3 Neurologic:  CN 2-12 grossly intact, moves all extremities in coordinated fashion, sensation intact    Radiological Exams on Admission: Dg Chest Port 1 View  Result Date: 05-12-2018 CLINICAL DATA:  History of atrial fibrillation.  Abnormal echo. EXAM: PORTABLE CHEST 1 VIEW COMPARISON:  March 06, 2007 FINDINGS: The cardiac silhouette appears enlarged although is poorly evaluated due to portable technique. No pneumothorax. The lungs are clear. No other acute abnormalities. IMPRESSION: The cardiac silhouette appears enlarged but is poorly evaluated due to portable technique. No other acute abnormalities. Electronically Signed   By: Gerome Sam III M.D   On: May 12, 2018 12:33    EKG: Independently reviewed.  Afib with rate 129; LPFB; nonspecific ST changes with no evidence of acute  ischemia   Labs on Admission: I have personally reviewed the available labs and imaging studies at the time of the admission.  Pertinent labs, no recent comparisons available:   K+ 5.3 CO2 19 Glucose 111 BUN 49/Creatinine 1.87/GFR 33 Troponin 0.02 WBC 8.8 Hgb 12.5 Platelets 127   Assessment/Plan Principal Problem:   Acute on chronic systolic CHF (congestive heart failure) (HCC) Active Problems:   Diabetes mellitus without complication (HCC)   A-fib (HCC)   CKD (chronic kidney disease),  stage III (HCC)   Acute on chronic systolic CHF -Patient with prior h/o EF 45% and chronic worsening SOB and edema -He presented to his outpatient physician and had a reported echo with EF of 21% -No pulmonary edema appreciated on exam or CXR -BNP pending -He reports chronic afib with chronic RVR to 120s -Will admit with telemetry -Will not request repeat echocardiogram unless desired by cardiology -Will start ASA -No ACE due to renal dysfunction -Continue Lopressor -CHF order set utilized -Was given Lasix 40 mg x 1 in ER and will repeat with 40 mg IV BID -Continue [place] O2 for now -Likely to need cath to r/o ischemic cause for worsening CHF but will defer to cardiology, who has already seen patient and will follow  Afib with uncontrolled rate, chronic -Patient presenting with chronic afib, uncontrolled rate  -Cardiology has seen and ordered Digoxin load and PO continuation as well as home Lopressor -Patient is on home Coumadin; will hold and do heparin dosing per pharmacy since he seems likely to need cath  Stage 3 CKD -Uncertain baseline but this is likely chronic -Will follow -Attempt to avoid nephrotoxic medications  DM -Will check A1c -hold Glucophage and Farxiga -Cover with moderate-scale SSI   DVT prophylaxis: Heparin Code Status:  DNR - confirmed with patient/family Family Communication: Daughter present throughout evaluation  Disposition Plan:  Home once clinically  improved Consults called: Cardiology; PT; CM  Admission status: Admit - It is my clinical opinion that admission to INPATIENT is reasonable and necessary because of the expectation that this patient will require hospital care that crosses at least 2 midnights to treat this condition based on the medical complexity of the problems presented.  Given the aforementioned information, the predictability of an adverse outcome is felt to be significant.    Jonah Blue MD [facility]  If note is complete, please contact covering daytime or nighttime physician. [email or web address] Password [email or web address]  2018-05-07, 4:20 PM

## 2018-04-17 NOTE — ED Notes (Signed)
Pt is A&OX 4 on assessment ;pt denies using walker or assistance to walk; pt denies chest pain at this time and HR still remains at 110 A-fib; Pt's family at bedside; Pt's paperwork transported to floor with pt-Monique,RN

## 2018-04-17 NOTE — ED Provider Notes (Signed)
82 yo male presents from pmd office with increased edema and decreased ef to 21%.  Patient reports worsening doe.  Vitals:   11-09-2017 1300 11-09-2017 1315  BP: 101/78 105/83  Pulse: (!) 41 (!) 25  Resp: (!) 21 20  SpO2: 98% 98%   HR 120s- nurses notes state pulse 41 and 25- no pe or ekg evidence to support this Lungs cta Diffuse pitting edema  Plan admission for further evaluation and treatment   Margarita Grizzleay, Monae Topping, MD 04/27/18 2350

## 2018-04-17 NOTE — Progress Notes (Addendum)
ANTICOAGULATION CONSULT NOTE - Initial Consult  Pharmacy Consult for Heparin (when INR<2) Indication: atrial fibrillation  Not on File  Patient Measurements: Weight: 196 lb (88.9 kg) Heparin Dosing Weight:   Vital Signs: BP: 111/79 (12/16 1530) Pulse Rate: 52 (12/16 1530)  Labs: Recent Labs    04/20/2018 1245  HGB 12.5*  HCT 41.1  PLT 127*  LABPROT 25.4*  INR 2.35  CREATININE 1.87*    CrCl cannot be calculated (Unknown ideal weight.).   Medical History: Past Medical History:  Diagnosis Date  . A-fib (HCC)    on Coumadin  . CHF (congestive heart failure) (HCC)   . Diabetes mellitus without complication (HCC)     Assessment: [age] YOM who presented on 12/16 with worsening LE edema and abnormal ECHO. The patient was on warfarin PTA for hx Afib - now plan is to hold and start Heparin when INR<2 as the patient will likely need a cardiac cath.   Admit INR therapeutic at 2.35 on PTA dose of 5 mg/day. Will hold warfarin and plan to initiate Heparin when INR<2.   Goal of Therapy:  INR 2-3 Heparin level 0.3-0.7 units/ml Monitor platelets by anticoagulation protocol: Yes   Plan:  - Hold Warfarin - Hold Heparin start until INR<2 - F/u INR with AM labs on 12/17  Thank you for allowing pharmacy to be a part of this patient's care.  Georgina PillionElizabeth Royal Vandevoort, PharmD, BCPS Clinical Pharmacist Please check AMION for all [facility] numbers 04/20/2018 4:36 PM

## 2018-04-17 NOTE — H&P (Deleted)
Cardiology Consult:   Patient ID: Ross Sweeney MRN: [id number]; DOB: 1935-05-23   Admission date: 04/22/2018  Primary Care Provider: Oval Linsey, MD Primary Cardiologist: Roosvelt Maser Primary Electrophysiologist:  None   Chief Complaint:  Worsening LE edema Consulted by Dr. Ophelia Charter  Patient Profile:   Ross Sweeney is a 82 y.o. male with chronic atrial fibrillation, DM II and CHF who was referred to [facility] for evaluation of worsening LE edema and abnormal echo  History of Present Illness:   Ross Sweeney is a 82 year old male with past medical history of chronic atrial fibrillation, HLD, DM 2 and CHF.  Patient has history of atrial fibrillation dating back to July 2006 as well as moderate mitral regurgitation.  He was previously followed by Dr. Lenise Herald.  He had a Myoview which showed inferolateral wall ischemia with EF of 45%, left this led to a cardiac catheterization on 03/17/2007 which showed scattered small vessel disease with 40% mid LAD lesion, 40% mid left circumflex lesion, 40% first OM lesion, 99% second OM lesion, 60% third OM lesion, EF 40 to 45%.  He has not been followed by cardiology service since.  He is on Coumadin therapy and this has been managed by his primary care provider.  According to patient, his last echocardiogram was several years ago which showed stable pumping function.  For the past month, he has been noticing worsening dyspnea on exertion and lower extremity edema.  With strenuous activity, he also noticed some substernal chest discomfort as well.  He is not on any fluid pill at home.  New echocardiogram was obtained on 04/14/2018, this showed EF 21%, global hypokinesis, moderately enlarged left atrium, moderately dilated RV, 3+ mitral and tricuspid regurgitation.  He was referred to [facility] for [facility] evaluation.  On arrival, he was noted to be in atrial fibrillation with RVR, heart rate of 130.  According to the patient, this is actually his  normal heart rate at home.  On physical exam, he also have significant diminished breath sound in the right base of the lung.  He has 3+ pitting edema on exam as well.  Cardiology was consulted for acute CHF in the setting of atrial fibrillation with RVR and worsening systolic EF.   Home medication: Coumadin 5 mg daily Lopressor 100 mg twice daily Crestor 20 mg daily Farxiga 10 mg Metformin 500 mg twice daily Vitamin D3 50,000 Testosterone gel   Past Medical History:  Diagnosis Date  . A-fib (HCC)   . CHF (congestive heart failure) (HCC)   . Diabetes mellitus without complication (HCC)     History reviewed. No pertinent surgical history.   Medications Prior to Admission: Prior to Admission medications   Medication Sig Start Date End Date Taking? Authorizing Provider  dapagliflozin propanediol (FARXIGA) 10 MG TABS tablet Take 10 mg by mouth daily.   Yes [provider]  Glucos-Chondroit-Hyaluron-MSM (GLUCOSAMINE CHONDROITIN JOINT PO) Take 1,000 mg by mouth 2 (two) times daily.   Yes [provider]  metFORMIN (GLUCOPHAGE) 500 MG tablet Take by mouth 2 (two) times daily with a meal.   Yes [provider]  metoprolol tartrate (LOPRESSOR) 100 MG tablet Take 100 mg by mouth 2 (two) times daily.   Yes [provider]  rosuvastatin (CRESTOR) 20 MG tablet Take 20 mg by mouth daily.   Yes [provider]  VITAMIN D, CHOLECALCIFEROL, PO Take 5,000 Units by mouth daily.   Yes [provider]  warfarin (COUMADIN) 5 MG  tablet Take 5 mg by mouth daily at 6 PM.   Yes [provider]     Allergies:   Not on File  Social History:   Social History   Socioeconomic History  . Marital status: Married    Spouse name: Not on file  . Number of children: Not on file  . Years of education: Not on file  . Highest education level: Not on file  Occupational History  . Not on file  Social Needs  . Financial resource strain: Not on file    . Food insecurity:    Worry: Not on file    Inability: Not on file  . Transportation needs:    Medical: Not on file    Non-medical: Not on file  Tobacco Use  . Smoking status: Former Games developer  . Smokeless tobacco: Never Used  Substance and Sexual Activity  . Alcohol use: Not Currently    Frequency: Never  . Drug use: Never  . Sexual activity: Not on file  Lifestyle  . Physical activity:    Days per week: Not on file    Minutes per session: Not on file  . Stress: Not on file  Relationships  . Social connections:    Talks on phone: Not on file    Gets together: Not on file    Attends religious service: Not on file    Active member of club or organization: Not on file    Attends meetings of clubs or organizations: Not on file    Relationship status: Not on file  . Intimate partner violence:    Fear of current or ex partner: Not on file    Emotionally abused: Not on file    Physically abused: Not on file    Forced sexual activity: Not on file  Other Topics Concern  . Not on file  Social History Narrative  . Not on file    Family History:   The patient's family history is not on file.    ROS:  Please see the history of present illness.  All other ROS reviewed and negative.     Physical Exam/Data:   Vitals:   04/12/2018 1345 04/23/2018 1400 04/27/2018 1415 04/08/2018 1430  BP: 105/88 106/76 110/79 112/73  Pulse: (!) 129 (!) 35 (!) 102 85  Resp: 19 16 15 15   SpO2: 97% 96% 97% 97%  Weight:       No intake or output data in the 24 hours ending 04/16/2018 1507 Filed Weights   04/26/2018 1205  Weight: 88.9 kg   There is no height or weight on file to calculate BMI.  General:  Well nourished, well developed, in no acute distress HEENT: normal Lymph: no adenopathy Neck: no JVD Endocrine:  No thryomegaly Vascular: No carotid bruits; FA pulses 2+ bilaterally without bruits  Cardiac:  Tachycardic, irregular; no murmur  Lungs:  Decreased breath sound in the R base  Abd: soft,  nontender, no hepatomegaly  Ext: 3+ pitting edema Musculoskeletal:  No deformities, BUE and BLE strength normal and equal Skin: warm and dry  Neuro:  CNs 2-12 intact, no focal abnormalities noted Psych:  Normal affect    EKG:  The ECG that was done in ED and was personally reviewed and demonstrates Atrial fibrillation with RVR  Relevant CV Studies:  PROCEDURES:  1. Left heart catheterization.  2. Coronary angiography.  3. Left ventriculogram.  4. Abdominal aortogram.   COMPLICATIONS:  None.   INDICATIONS:  Ross Sweeney is a  82 year old male patient of Dr. Delbert Harness  with a history of atrial fibrillation dating back to July 2006 as well  as moderate mitral regurgitation.  He did undergo a Cardiolite scan  suggesting inferolateral wall ischemia with an EF of 45%.  He has had no  significant chest pain.  After discussing with his wife, we have elected  to proceed with cardiac catheterization to rule out the possibility of  CAD given his underlying atrial fibrillation.   DESCRIPTION:  After informed consent, the patient was taken to the  cardiac cath lab.  The right groin was shaved, prepped and draped in the  sterile fashion.  ECG monitoring was established. Using modified  Seldinger, number 6 French arterial sheath  was inserted in the right  femoral artery.  A 6-French diagnostic catheter and performed diagnostic  angiography.   OPINION:  1. Left main is a large vessel with no significant disease.  2. LAD is a large vessel coursing through the apex giving rise to two      diagonal branch.  The LAD is coarse and irregular throughout its      proximal and early mid segment up to 40% stenosis.  3. The first diagonal is a medium-sized vessel with no significant      disease.  4. The second diagonal is a small vessel with no significant disease.  5. Left circumflex is a medium-size vessel coursing to the AV groove      and gives three obtuse marginal branches.  The AV  circumflex has      40% midvessel narrowing.  6. First OM is a mid vessel which bifurcates distally with a 40 %      midvessel lesion.  7. Second OM is a small vessel with 99% ostial lesion.  The third OM      is a small to medium size vessel with diffuse 60% narrowing      throughout.  8. The right coronary artery is a large vessel which is dominant and      gives rise to the PDA as well as the posterolateral branch.  There      is no significant disease in the RCA, PDA or posterolateral branch.  9. Left ventriculogram reveals a mildly depressed EF of 40-45% with      mild global hypokinesis.  There does appear to be moderate mitral      regurgitation, though  there is significant amount of ectopy noted.  10.Abdominal aortogram reveals no evidence of significant renal artery      stenosis or aneurysm.   HEMODYNAMIC SYSTEM:  Right arterial pressure 102/70, LV systemic  pressure 102/10, LVEDP of 22.   CONCLUSION:  1. Scattered small vessel disease best treated medically.  2. Mildly depressed left ventricular systolic function.  3. Moderate mitral regurgitation.  4. No evidence of renal artery stenosis.  5. Elevated left ventricular end-diastolic pressure.  Laboratory Data:  Chemistry Recent Labs  Lab 04/07/2018 1245  NA 140  K 5.3*  CL 111  CO2 19*  GLUCOSE 111*  BUN 49*  CREATININE 1.87*  CALCIUM 8.9  GFRNONAA 33*  GFRAA 38*  ANIONGAP 10    No results for input(s): PROT, ALBUMIN, AST, ALT, ALKPHOS, BILITOT in the last 168 hours. Hematology Recent Labs  Lab 04/29/2018 1245  WBC 8.8  RBC 4.21*  HGB 12.5*  HCT 41.1  MCV 97.6  MCH 29.7  MCHC 30.4  RDW 15.4  PLT 127*   Cardiac EnzymesNo results  for input(s): TROPONINI in the last 168 hours.  Recent Labs  Lab 05/01/2018 1256  TROPIPOC 0.02    BNPNo results for input(s): BNP, PROBNP in the last 168 hours.  DDimer No results for input(s): DDIMER in the last 168 hours.  Radiology/Studies:  Dg Chest Port 1  View  Result Date: 04/10/2018 CLINICAL DATA:  History of atrial fibrillation.  Abnormal echo. EXAM: PORTABLE CHEST 1 VIEW COMPARISON:  March 06, 2007 FINDINGS: The cardiac silhouette appears enlarged although is poorly evaluated due to portable technique. No pneumothorax. The lungs are clear. No other acute abnormalities. IMPRESSION: The cardiac silhouette appears enlarged but is poorly evaluated due to portable technique. No other acute abnormalities. Electronically Signed   By: Gerome Samavid  Williams III M.D   On: 04/22/2018 12:33    Assessment and Plan:   1. Acute on chronic systolic heart failure:  - will discuss with MD, given the degree of cardiomyopathy, IV diuresis with 40mg  BID IV lasix during the time being, transition Coumadin to IV heparin, once fully diuresed consider left and right heart cath.  -Most likely reason for drop in the ejection fraction is tachycardia mediated cardiomyopathy due to uncontrolled heart rate with atrial fibrillation, however with her previous coronary artery disease and worsening EF, will need to rule out underlying progression of coronary artery disease.  - titrate CHF medication, convert lopressor to Toprol XL later once HR better controlled. Add Entresto prior to D/C.   2. Atrial fibrillation with RVR: Despite the patient is on Lopressor 100 mg twice daily, his heart rate is very much uncontrolled with heart rate of 130.  Surprisingly, according to the patient, this is normal heart rate for him at home.  This likely has resulted in the worsening ejection fraction and acute CHF exacerbation.  - start on digoxin for better rate control. Continue lopressor, consider switch to Toprol XL later  3. CAD: Last cardiac catheterization was in 2008, cath report above, mild to moderate disease in large vessel, mainly small vessel disease in the obtuse marginal branch.  4. DM 2: Last hemoglobin A1c obtained in July 2019 was 6.5  5. HLD: Recent fasting lipid panel obtained  in July 2019 showed a total cholesterol 113, HDL 36, triglyceride 113, LDL 57.  6. Renal insufficiency of unknown duration   Severity of Illness: The appropriate patient status for this patient is INPATIENT. Inpatient status is judged to be reasonable and necessary in order to provide the required intensity of service to ensure the patient's safety. The patient's presenting symptoms, physical exam findings, and initial radiographic and laboratory data in the context of their chronic comorbidities is felt to place them at high risk for further clinical deterioration. Furthermore, it is not anticipated that the patient will be medically stable for discharge from the hospital within 2 midnights of admission. The following factors support the patient status of inpatient.   " The patient's presenting symptoms include chest pain, LE edema. " The worrisome physical exam findings include 3+ pitting edema. " The initial radiographic and laboratory data are worrisome because of atrial fibrillation with RVR, abnormal outside echo. " The chronic co-morbidities include Chronic atrial fibrillation, CHF, DM II, and HLD.   * I certify that at the point of admission it is my clinical judgment that the patient will require inpatient hospital care spanning beyond 2 midnights from the point of admission due to high intensity of service, high risk for further deterioration and high frequency of surveillance required.*  For questions or updates, please contact [facility] Please consult [email or web address] for contact info under        Ramond Dial, [place]  04/22/2018 3:07 PM

## 2018-04-17 NOTE — ED Provider Notes (Signed)
[facility] Provider Note   CSN: [id number] Arrival date & time: 04/12/2018  1150     History   Chief Complaint Chief Complaint  Patient presents with  . Abnormal test results    sent by doc    HPI Ross Sweeney is a 82 y.o. male who presents the emergency department sent in by his PCP Dr. Janna Archondiego for cardiac evaluation.  He has a past medical history of chronic atrial fibrillation anticoagulated on Coumadin, CHF and diabetes.  Patient states that about 3 weeks ago he had a sudden change in his activity level.  He is trying to walk about 3000 steps daily however he had new onset of significantly worsening exertional dyspnea and bilateral leg swelling.  He saw Dr. Janna Archondiego who got an new echocardiogram which showed a precipitous drop in his EF down to 21% and global left ventricular hypokinesis.  Handwritten note by Dr. Janna Archondiego was sent in stating he felt he might need a catheterization and hospital admission.  Patient denies any shortness of breath at rest, chest pain. HPI  Past Medical History:  Diagnosis Date  . A-fib (HCC)    on Coumadin  . CHF (congestive heart failure) (HCC)   . Diabetes mellitus without complication [facility](HCC)     Patient Active Problem List   Diagnosis Date Noted  . Acute on chronic systolic CHF (congestive heart failure) (HCC) 04/21/2018    Past Surgical History:  Procedure Laterality Date  . KNEE ARTHROSCOPY Right         Home Medications    Prior to Admission medications   Medication Sig Start Date End Date Taking? Authorizing Provider  dapagliflozin propanediol (FARXIGA) 10 MG TABS tablet Take 10 mg by mouth daily.   Yes [provider]  Glucos-Chondroit-Hyaluron-MSM (GLUCOSAMINE CHONDROITIN JOINT PO) Take 1,000 mg by mouth 2 (two) times daily.   Yes [provider]  metFORMIN (GLUCOPHAGE) 500 MG tablet Take by mouth 2 (two) times daily with a meal.   Yes [provider]    metoprolol tartrate (LOPRESSOR) 100 MG tablet Take 100 mg by mouth 2 (two) times daily.   Yes [provider]  rosuvastatin (CRESTOR) 20 MG tablet Take 20 mg by mouth daily.   Yes [provider]  VITAMIN D, CHOLECALCIFEROL, PO Take 5,000 Units by mouth daily.   Yes [provider]  warfarin (COUMADIN) 5 MG tablet Take 5 mg by mouth daily at 6 PM.   Yes [provider]    Family History Family History  Problem Relation Age of Onset  . Cancer Mother 7280  . Lupus Mother   . Heart failure Father 6190    Social History Social History   Tobacco Use  . Smoking status: Never Smoker  . Smokeless tobacco: Never Used  Substance Use Topics  . Alcohol use: Not Currently    Frequency: Never  . Drug use: Never     Allergies   Patient has no allergy information on record.   Review of Systems Review of Systems Ten systems reviewed and are negative for acute change, except as noted in the HPI.    Physical Exam Updated Vital Signs BP 111/79   Pulse (!) 52   Resp (!) 22   Wt 88.9 kg   SpO2 96%   Physical Exam Vitals signs and nursing note reviewed.  Constitutional:      General: He is not in acute distress.    Appearance: He is well-developed. He  is not diaphoretic.  HENT:     Head: Normocephalic and atraumatic.  Eyes:     General: No scleral icterus.    Conjunctiva/sclera: Conjunctivae normal.  Neck:     Musculoskeletal: Normal range of motion and neck supple.  Cardiovascular:     Rate and Rhythm: Regular rhythm. Tachycardia present.     Heart sounds: Normal heart sounds.  Pulmonary:     Effort: Pulmonary effort is normal. No respiratory distress.     Breath sounds: Normal breath sounds.  Abdominal:     Palpations: Abdomen is soft.     Tenderness: There is no abdominal tenderness.  Musculoskeletal:     Right lower leg: Edema present.     Left lower leg: Edema present.  Skin:    General: Skin is warm and dry.  Neurological:      Mental Status: He is alert and oriented to person, place, and time. Mental status is at baseline.  Psychiatric:        Behavior: Behavior normal.        Judgment: Judgment normal.      ED Treatments / Results  Labs (all labs ordered are listed, but only abnormal results are displayed) Labs Reviewed  BASIC METABOLIC PANEL - Abnormal; Notable for the following components:      Result Value   Potassium 5.3 (*)    CO2 19 (*)    Glucose, Bld 111 (*)    BUN 49 (*)    Creatinine, Ser 1.87 (*)    GFR calc non Af Amer 33 (*)    GFR calc Af Amer 38 (*)    All other components within normal limits  CBC - Abnormal; Notable for the following components:   RBC 4.21 (*)    Hemoglobin 12.5 (*)    Platelets 127 (*)    All other components within normal limits  PROTIME-INR - Abnormal; Notable for the following components:   Prothrombin Time 25.4 (*)    All other components within normal limits  CULTURE, BLOOD (ROUTINE X 2)  CULTURE, BLOOD (ROUTINE X 2)  URINE CULTURE  URINALYSIS, ROUTINE W REFLEX MICROSCOPIC  I-STAT TROPONIN, ED  I-STAT CG4 LACTIC ACID, ED  I-STAT CHEM 8, ED  I-STAT CG4 LACTIC ACID, ED    EKG EKG Interpretation  Date/Time:  Monday 04/28/18 12:35:22 EST Ventricular Rate:  129 PR Interval:    QRS Duration: 93 QT Interval:  313 QTC Calculation: 459 R Axis:   130 Text Interpretation:  Atrial fibrillation Left posterior fascicular block Borderline T abnormalities, inferior leads Confirmed by Margarita Grizzle [phone]) on 2018-04-28 1:40:39 PM   Radiology Dg Chest Port 1 View  Result Date: 04-28-2018 CLINICAL DATA:  History of atrial fibrillation.  Abnormal echo. EXAM: PORTABLE CHEST 1 VIEW COMPARISON:  March 06, 2007 FINDINGS: The cardiac silhouette appears enlarged although is poorly evaluated due to portable technique. No pneumothorax. The lungs are clear. No other acute abnormalities. IMPRESSION: The cardiac silhouette appears enlarged but is poorly  evaluated due to portable technique. No other acute abnormalities. Electronically Signed   By: Gerome Sam III M.D   On: 28-Apr-2018 12:33    Procedures Procedures (including critical care time)  Medications Ordered in ED Medications  metoprolol tartrate (LOPRESSOR) tablet 100 mg (100 mg Oral Given 2018-04-28 1506)  furosemide (LASIX) injection 40 mg (has no administration in time range)  digoxin (LANOXIN) 0.25 MG/ML injection 0.25 mg (has no administration in time range)  digoxin (LANOXIN) tablet 0.0625 mg (has  no administration in time range)     Initial Impression / Assessment and Plan / ED Course  I have reviewed the triage vital signs and the nursing notes.  Pertinent labs & imaging results that were available during my care of the patient were reviewed by me and considered in my medical decision making (see chart for details).     Patient with Worsening EF, Exertional dyspnea, and peripheral edema.  The patient will be admitted to the hospitalist service. I have spoken with Dr. Duke Salvia who will consult on the paitent. The patient appears reasonably screened and/or stabilized for discharge and I doubt any other medical condition or other [facility] requiring further screening, evaluation, or treatment in the ED at this time prior to admission.   Final Clinical Impressions(s) / ED Diagnoses   Final diagnoses:  Acute congestive heart failure, unspecified heart failure type [facility])    ED Discharge Orders    None       Arthor Captain, PA-C 02-May-2018 1550    Margarita Grizzle, MD 04/27/18 [phone]

## 2018-04-17 NOTE — ED Triage Notes (Addendum)
Pt sent from Dr. Janeece Fittingon Diego's office for concerns of echo results today, suggesting pt may need a heart cath today. HR 120-130's afib. Pt arrives a&ox4, denies any cp, sob or other symptoms.

## 2018-04-18 ENCOUNTER — Other Ambulatory Visit: Payer: Self-pay

## 2018-04-18 DIAGNOSIS — I5023 Acute on chronic systolic (congestive) heart failure: Secondary | ICD-10-CM

## 2018-04-18 LAB — CBC WITH DIFFERENTIAL/PLATELET
Abs Immature Granulocytes: 0.01 10*3/uL (ref 0.00–0.07)
Basophils Absolute: 0 10*3/uL (ref 0.0–0.1)
Basophils Relative: 0 %
EOS PCT: 1 %
Eosinophils Absolute: 0.1 10*3/uL (ref 0.0–0.5)
HCT: 38.2 % — ABNORMAL LOW (ref 39.0–52.0)
Hemoglobin: 11.6 g/dL — ABNORMAL LOW (ref 13.0–17.0)
Immature Granulocytes: 0 %
LYMPHS PCT: 24 %
Lymphs Abs: 1.7 10*3/uL (ref 0.7–4.0)
MCH: 29.2 pg (ref 26.0–34.0)
MCHC: 30.4 g/dL (ref 30.0–36.0)
MCV: 96.2 fL (ref 80.0–100.0)
MONO ABS: 0.9 10*3/uL (ref 0.1–1.0)
Monocytes Relative: 13 %
NEUTROS ABS: 4.3 10*3/uL (ref 1.7–7.7)
Neutrophils Relative %: 62 %
Platelets: 132 10*3/uL — ABNORMAL LOW (ref 150–400)
RBC: 3.97 MIL/uL — ABNORMAL LOW (ref 4.22–5.81)
RDW: 15.3 % (ref 11.5–15.5)
WBC: 6.9 10*3/uL (ref 4.0–10.5)
nRBC: 0 % (ref 0.0–0.2)

## 2018-04-18 LAB — PROTIME-INR
INR: 2.35
PROTHROMBIN TIME: 25.4 s — AB (ref 11.4–15.2)

## 2018-04-18 LAB — GLUCOSE, CAPILLARY
Glucose-Capillary: 88 mg/dL (ref 70–99)
Glucose-Capillary: 135 mg/dL — ABNORMAL HIGH (ref 70–99)
Glucose-Capillary: 123 mg/dL — ABNORMAL HIGH (ref 70–99)
Glucose-Capillary: 139 mg/dL — ABNORMAL HIGH (ref 70–99)

## 2018-04-18 LAB — BASIC METABOLIC PANEL
Anion gap: 10 (ref 5–15)
BUN: 47 mg/dL — AB (ref 8–23)
CO2: 23 mmol/L (ref 22–32)
Calcium: 8.5 mg/dL — ABNORMAL LOW (ref 8.9–10.3)
Chloride: 107 mmol/L (ref 98–111)
Creatinine, Ser: 1.92 mg/dL — ABNORMAL HIGH (ref 0.61–1.24)
GFR calc Af Amer: 37 mL/min — ABNORMAL LOW (ref >60)
GFR calc non Af Amer: 32 mL/min — ABNORMAL LOW (ref >60)
Glucose, Bld: 103 mg/dL — ABNORMAL HIGH (ref 70–99)
Potassium: 4.9 mmol/L (ref 3.5–5.1)
Sodium: 140 mmol/L (ref 135–145)

## 2018-04-18 MED ORDER — METOPROLOL TARTRATE 100 MG PO TABS
100.0000 mg | ORAL_TABLET | Freq: Two times a day (BID) | ORAL | Status: AC
  Administered 2018-04-18: 100 mg via ORAL
  Filled 2018-04-18: qty 1

## 2018-04-18 MED ORDER — METOPROLOL SUCCINATE ER 100 MG PO TB24
200.0000 mg | ORAL_TABLET | Freq: Every day | ORAL | Status: DC
  Administered 2018-04-19 – 2018-04-20 (×2): 200 mg via ORAL
  Filled 2018-04-18 (×5): qty 2

## 2018-04-18 NOTE — Plan of Care (Signed)
Nutrition Education Note  RD consulted for nutrition education regarding CHF and diabetes.  Lab Results  Component Value Date   HGBA1C 6.1 (H) 04/18/2018    PTA DM medications are . 500 mg metfomrin BID and 10 mg dapaglifozin propanediol  Labs reviewed: CBGS: 88 (inpatient orders for glycemic control are none).  Spoke with pt at bedside, who reports he tries to minimize salt in his diet. He shares that he usually cooks at home- meals consist of grilled steak and canned vegetables, which he rinses, drains, and boils in fresh water. Per pt, he will sometimes add salt to food "if there isn't some already in it". Pt also reports going to [employer], [employer], or [place] restaurants 1-2 times per week to socialize with friends.   Pt takes pride in good glycemic control. He is vigilant about checking CBGS daily- reports the highest his blood sugar has been was 95 over the past several months.    Pt shares he weighs himself a few times per week after getting up. He became concerned PTA when his weight was increasing.   Focus on education was how to limit added sodium in diet, as well as importance of self-management of DM and CHF to maintain quality of life and prevent further complications  RD provided "Heart Healthy, Consistent Carbohydrate Nutrition Therapy" handout from the Academy of Nutrition and Dietetics. Reviewed patient's dietary recall. Provided examples on ways to decrease sodium intake in diet. Discouraged intake of processed foods and use of salt shaker. Encouraged fresh fruits and vegetables as well as whole grain sources of carbohydrates to maximize fiber intake.   RD discussed why it is important for patient to adhere to diet recommendations, and emphasized the role of fluids, foods to avoid, and importance of weighing self daily.   Discussed different food groups and their effects on blood sugar, emphasizing carbohydrate-containing foods. Provided list of carbohydrates and  recommended serving sizes of common foods.  Discussed importance of controlled and consistent carbohydrate intake throughout the day. Provided examples of ways to balance meals/snacks and encouraged intake of high-fiber, whole grain complex carbohydrates. Teach back method used.  Expect fair compliance.  Body mass index is 30.59 kg/m. Pt meets criteria for obesity, class I based on current BMI.  Current diet order is Heart Healthy/ Carb Modified, patient is consuming approximately 100% of meals at this time. Labs and medications reviewed. No further nutrition interventions warranted at this time. RD contact information provided. If additional nutrition issues arise, please re-consult RD.   Madlynn Lundeen A. Mayford KnifeWilliams, RD, LDN, CDE Pager: [phone] After hours Pager: [phone]

## 2018-04-18 NOTE — Progress Notes (Signed)
   04/10/2018 2013  Vitals  Temp 98.4 F (36.9 C)  Temp Source Oral  BP 114/74  MAP (mmHg) 88  BP Location Right Arm  BP Method Automatic  Patient Position (if appropriate) Lying  Pulse Rate 91  Pulse Rate Source Monitor  Resp 18  Oxygen Therapy  SpO2 95 %  O2 Device Room Air  Admitted pt to rm 3E03 from ED, pt alert and oriented, denied pain at this time, oriented to room, call bell placed within reach, placed on cardiac monitor CCMD made aware.

## 2018-04-18 NOTE — Evaluation (Signed)
Physical Therapy Evaluation & Discharge Patient Details Name: Ross Sweeney MRN: [id number] DOB: 10-05-1935 Today's Date: 04/18/2018   History of Present Illness  Pt is an 82 y.o. male admitted 05/09/2018 with SOB; worked up for newly diagnosed acute systolic HF and a-fib with RVR. PMH includes DM, CHF, a-fib on Coumadin.    Clinical Impression  Patient evaluated by Physical Therapy with no further acute PT needs identified. PTA, pt indep and lives alone; drives and involved with supportive church community. Today, pt indep with mobility; SpO2 >92% on RA throughout. Educ on energy conservation strategies and fall risk reduction. All education has been completed and the patient has no further questions. Acute PT is signing off. Thank you for this referral.    Follow Up Recommendations No PT follow up    Equipment Recommendations  None recommended by PT    Recommendations for Other Services       Precautions / Restrictions Precautions Precautions: None Restrictions Weight Bearing Restrictions: No      Mobility  Bed Mobility Overal bed mobility: Independent                Transfers Overall transfer level: Independent                  Ambulation/Gait Ambulation/Gait assistance: Independent Gait Distance (Feet): 200 Feet Assistive device: None Gait Pattern/deviations: Step-through pattern;Decreased stride length   Gait velocity interpretation: >2.62 ft/sec, indicative of community ambulatory    Stairs            Wheelchair Mobility    Modified Rankin (Stroke Patients Only)       Balance Overall balance assessment: Needs assistance;No apparent balance deficits (not formally assessed)   Sitting balance-Leahy Scale: Normal       Standing balance-Leahy Scale: Good               High level balance activites: Side stepping;Backward walking;Direction changes;Turns;Sudden stops;Head turns High Level Balance Comments: No overt instability or LOB  with higher level balance activities             Pertinent Vitals/Pain Pain Assessment: No/denies pain    Home Living Family/patient expects to be discharged to:: Private residence Living Arrangements: Alone Available Help at Discharge: Friend(s);Available PRN/intermittently Type of Home: House Home Access: Stairs to enter;Level entry   Entrance Stairs-Number of Steps: 3 Home Layout: One level Home Equipment: Cane - single point      Prior Function Level of Independence: Independent         Comments: Indep with mobility and ADLs. Drives. Multiple friends nearby from church     Hand Dominance        Extremity/Trunk Assessment   Upper Extremity Assessment Upper Extremity Assessment: Overall WFL for tasks assessed    Lower Extremity Assessment Lower Extremity Assessment: Overall WFL for tasks assessed       Communication   Communication: No difficulties  Cognition Arousal/Alertness: Awake/alert Behavior During Therapy: WFL for tasks assessed/performed Overall Cognitive Status: Within Functional Limits for tasks assessed                                 General Comments: Some tangential speech/poor attention, but able to be redirected well. Likely baseline cognition      General Comments General comments (skin integrity, edema, etc.): SpO2 >92% on RA throughout    Exercises     Assessment/Plan    PT Assessment Patent does  not need any further PT services  PT Problem List         PT Treatment Interventions      PT Goals (Current goals can be found in the Care Plan section)  Acute Rehab PT Goals PT Goal Formulation: All assessment and education complete, DC therapy    Frequency     Barriers to discharge        Co-evaluation               AM-PAC PT "6 Clicks" Mobility  Outcome Measure Help needed turning from your back to your side while in a flat bed without using bedrails?: None Help needed moving from lying on your  back to sitting on the side of a flat bed without using bedrails?: None Help needed moving to and from a bed to a chair (including a wheelchair)?: None Help needed standing up from a chair using your arms (e.g., wheelchair or bedside chair)?: None Help needed to walk in hospital room?: None Help needed climbing 3-5 steps with a railing? : None 6 Click Score: 24    End of Session Equipment Utilized During Treatment: Gait belt Activity Tolerance: Patient tolerated treatment well Patient left: in bed;with call bell/phone within reach Nurse Communication: Mobility status PT Visit Diagnosis: Other abnormalities of gait and mobility (R26.89)    Time: [id number] PT Time Calculation (min) (ACUTE ONLY): 21 min   Charges:   PT Evaluation $PT Eval Low Complexity: 1 Low        Ina HomesJaclyn Ladarrell Cornwall, PT, DPT Acute Rehabilitation Services  Pager [phone] Office [phone]  Malachy ChamberJaclyn L Kaitlyn Franko 04/18/2018, 1:13 PM

## 2018-04-18 NOTE — Progress Notes (Signed)
PROGRESS NOTE  Ross Sweeney ZOX:[id number] DOB: [birth year] DOA: 04/09/2018 PCP: Oval Linsey, MD  HPI/Recap of past 24 hours: Ross Sweeney is a 82 y.o. male with medical history significant of DM; afib; and CHF presenting after being sent by PCP. Dr. Janna Arch did an echo this AM and said since the last one - 8-9 years ago - something caused the heart muscle to weaken and so he sent him in.  He has afib and it "runs wide open all the time."  The patinet isn't sure why he did the echo, but he does have DOE and LE edema.  He can not walk through his house without feeling SOB.  Symptoms have been going on "this year."  Occasional nonproductive cough.  +wheezing, occasional, most days.  +LE edema x 3-4 weeks.  No weight gain since the edema started but his weight is up prior.  He had been started "on a pill to take it off but I told him this morning it wasn't taking it off."  His weight this AM was 196, usual weight 170-185.  No orthopnea.  No PND.  He wakes up thirsty.  He only feels SOB with ambulation.  04/18/18: Patient seen and examined at bedside.  Denies any chest pain or palpitations however he admits to dyspnea when he ambulates.  Assessment/Plan: Principal Problem:   Acute on chronic systolic CHF (congestive heart failure) (HCC) Active Problems:   Diabetes mellitus without complication (HCC)   A-fib (HCC)   CKD (chronic kidney disease), stage III (HCC)  Acute on chronic combined diastolic and systolic CHF Prior history of LVEF 45% Had a reported echo with LVEF of 21% Chest x-ray done on 04/08/2018 independently reviewed revealed cardiomegaly with mild increase in pulmonary vascularity. Continue Lasix 40 mg twice daily Continue strict I's and O's Continue to monitor volume status Continue to diurese as recommended by cardiology  Chronic A. fib with uncontrolled rate Cardiology has been consulted and following Rate is now controlled on Toprol-XL 200 mg daily Also on digoxin  0.0625 mg oral daily On Coumadin for primary CVA prophylaxis Coumadin is on hold today due to possible stress test in the morning If stress test is negative warfarin will be resumed  Mitral regurgitation/tricuspid regurgitation Cardiology suspects this is likely functional  Hyperlipidemia Continue Crestor  Type 2 diabetes A1c 6.1 Appears well controlled on metformin at home Sensitive insulin sliding scale Avoid hypoglycemia  DVT prophylaxis:  Coumadin Code Status:  DNR - Family Communication:  None at bedside Disposition Plan:   Home when cardiology signs off Consults called: Cardiology;     Objective: Vitals:   04/18/18 0754 04/18/18 0823 04/18/18 1233 04/18/18 1648  BP: 104/80 112/89 97/63 112/89  Pulse: 77 91 85 84  Resp: 16  19 17   Temp: (!) 97.5 F (36.4 C)  98.1 F (36.7 C) 98 F (36.7 C)  TempSrc: Oral  Oral Oral  SpO2: 97%  97% 97%  Weight:      Height:        Intake/Output Summary (Last 24 hours) at 04/18/2018 1707 Last data filed at 04/18/2018 1705 Gross per 24 hour  Intake 600 ml  Output 4900 ml  Net -4300 ml   Filed Weights   04/14/2018 1205 04/19/2018 2013 04/18/18 0441  Weight: 88.9 kg 88.9 kg 88.6 kg    Exam:  . General: 82 y.o. year-old male well developed well nourished in no acute distress.  Alert and oriented x3. . Cardiovascular: Regular rate and  rhythm with no rubs or gallops.  No thyromegaly or JVD noted.   Marland Kitchen. Respiratory: Clear to auscultation with no wheezes or rales. Good inspiratory effort. . Abdomen: Soft nontender nondistended with normal bowel sounds x4 quadrants. . Musculoskeletal: No lower extremity edema. 2/4 pulses in all 4 extremities. . Skin: No ulcerative lesions noted or rashes . Psychiatry: Mood is appropriate for condition and setting   Data Reviewed: CBC: Recent Labs  Lab 04/14/2018 1245 04/15/2018 2327  WBC 8.8 6.9  NEUTROABS  --  4.3  HGB 12.5* 11.6*  HCT 41.1 38.2*  MCV 97.6 96.2  PLT 127* 132*   Basic  Metabolic Panel: Recent Labs  Lab 04/14/2018 1245 04/18/18 0423  NA 140 140  K 5.3* 4.9  CL 111 107  CO2 19* 23  GLUCOSE 111* 103*  BUN 49* 47*  CREATININE 1.87* 1.92*  CALCIUM 8.9 8.5*   GFR: Estimated Creatinine Clearance: 32.6 mL/min (A) (by C-G formula based on SCr of 1.92 mg/dL (H)). Liver Function Tests: No results for input(s): AST, ALT, ALKPHOS, BILITOT, PROT, ALBUMIN in the last 168 hours. No results for input(s): LIPASE, AMYLASE in the last 168 hours. No results for input(s): AMMONIA in the last 168 hours. Coagulation Profile: Recent Labs  Lab 04/23/2018 1245 04/18/18 0423  INR 2.35 2.35   Cardiac Enzymes: No results for input(s): CKTOTAL, CKMB, CKMBINDEX, TROPONINI in the last 168 hours. BNP (last 3 results) No results for input(s): PROBNP in the last 8760 hours. HbA1C: Recent Labs    04/29/2018 1629  HGBA1C 6.1*   CBG: Recent Labs  Lab 04/28/2018 2050 04/18/18 0751 04/18/18 1229  GLUCAP 135* 88 135*   Lipid Profile: No results for input(s): CHOL, HDL, LDLCALC, TRIG, CHOLHDL, LDLDIRECT in the last 72 hours. Thyroid Function Tests: No results for input(s): TSH, T4TOTAL, FREET4, T3FREE, THYROIDAB in the last 72 hours. Anemia Panel: No results for input(s): VITAMINB12, FOLATE, FERRITIN, TIBC, IRON, RETICCTPCT in the last 72 hours. Urine analysis: No results found for: COLORURINE, APPEARANCEUR, LABSPEC, PHURINE, GLUCOSEU, HGBUR, BILIRUBINUR, KETONESUR, PROTEINUR, UROBILINOGEN, NITRITE, LEUKOCYTESUR Sepsis Labs: @LABRCNTIP (procalcitonin:4,lacticidven:4)  ) Recent Results (from the past 240 hour(s))  Blood Culture (routine x 2)     Status: None (Preliminary result)   Collection Time: 04/04/2018  5:50 PM  Result Value Ref Range Status   Specimen Description BLOOD LEFT HAND  Final   Special Requests   Final    BOTTLES DRAWN AEROBIC AND ANAEROBIC Blood Culture results may not be optimal due to an excessive volume of blood received in culture bottles   Culture    Final    NO GROWTH < 24 HOURS Performed at [facility], [address] [place], [place] [address]    Report Status PENDING  Incomplete  Blood Culture (routine x 2)     Status: None (Preliminary result)   Collection Time: 04/04/2018  5:50 PM  Result Value Ref Range Status   Specimen Description BLOOD RIGHT HAND  Final   Special Requests   Final    BOTTLES DRAWN AEROBIC AND ANAEROBIC Blood Culture results may not be optimal due to an excessive volume of blood received in culture bottles   Culture   Final    NO GROWTH < 24 HOURS Performed at [facility], [address] [place], [place] [address]    Report Status PENDING  Incomplete      Studies: No results found.  Scheduled Meds: . aspirin EC  81 mg Oral Daily  .  digoxin  0.0625 mg Oral Daily  . furosemide  40 mg Intravenous BID  . [START ON 04/19/2018] metoprolol succinate  200 mg Oral Daily  . metoprolol tartrate  100 mg Oral BID  . rosuvastatin  20 mg Oral q1800  . sodium chloride flush  3 mL Intravenous Q12H    Continuous Infusions: . sodium chloride       LOS: 1 day     Darlin Drop, MD [facility] Pager [phone]  If 7PM-7AM, please contact night-coverage [email or web address] Password [email or web address] 04/18/2018, 5:07 PM

## 2018-04-18 NOTE — Progress Notes (Signed)
Progress Note  Patient Name: Ross Sweeney Date of Encounter: 04/18/2018  Primary Cardiologist: No primary care provider on file.   Subjective   Feeling well.  No palpitations, chest pain, or shortness of breath.    Inpatient Medications    Scheduled Meds: . aspirin EC  81 mg Oral Daily  . digoxin  0.0625 mg Oral Daily  . furosemide  40 mg Intravenous BID  . metoprolol tartrate  100 mg Oral BID  . rosuvastatin  20 mg Oral q1800  . sodium chloride flush  3 mL Intravenous Q12H   Continuous Infusions: . sodium chloride     PRN Meds: sodium chloride, acetaminophen, ondansetron (ZOFRAN) IV, sodium chloride flush   Vital Signs    Vitals:   04/18/18 0441 04/18/18 0441 04/18/18 0754 04/18/18 0823  BP:  118/84 104/80 112/89  Pulse:  90 77 91  Resp:  18 16   Temp:  97.7 F (36.5 C) (!) 97.5 F (36.4 C)   TempSrc:  Oral Oral   SpO2:  96% 97%   Weight: 88.6 kg     Height:        Intake/Output Summary (Last 24 hours) at 04/18/2018 0903 Last data filed at 04/18/2018 0600 Gross per 24 hour  Intake 240 ml  Output 2425 ml  Net -2185 ml   Filed Weights   2018/05/07 1205 2018-05-07 2013 04/18/18 0441  Weight: 88.9 kg 88.9 kg 88.6 kg    Telemetry    Atrial fibrillation.  Rate mostly <100 bpm.  4 beats NSVT. - Personally Reviewed  ECG    n/a - Personally Reviewed  Physical Exam   VS:  BP 112/89   Pulse 91   Temp (!) 97.5 F (36.4 C) (Oral)   Resp 16   Ht 5\' 7"  (1.702 m)   Wt 88.6 kg   SpO2 97%   BMI 30.59 kg/m  , BMI Body mass index is 30.59 kg/m. GENERAL:  Well appearing HEENT: Pupils equal round and reactive, fundi not visualized, oral mucosa unremarkable NECK:  + jugular venous distention, waveform within normal limits, carotid upstroke brisk and symmetric, no bruits, no thyromegaly LYMPHATICS:  No cervical adenopathy LUNGS:  Clear to auscultation bilaterally HEART:  Irregularly irregular.  PMI not displaced or sustained,S1 and S2 within normal limits,  no S3, no S4, no clicks, no rubs, III/VI systolic murmurs ABD:  Flat, positive bowel sounds normal in frequency in pitch, no bruits, no rebound, no guarding, no midline pulsatile mass, no hepatomegaly, no splenomegaly EXT:  2 plus pulses throughout, no edema, no cyanosis no clubbing SKIN:  No rashes no nodules NEURO:  Cranial nerves II through XII grossly intact, motor grossly intact throughout [facility]  Cognitively intact, oriented to person place and time   Labs    Chemistry Recent Labs  Lab 2018-05-07 1245 04/18/18 0423  NA 140 140  K 5.3* 4.9  CL 111 107  CO2 19* 23  GLUCOSE 111* 103*  BUN 49* 47*  CREATININE 1.87* 1.92*  CALCIUM 8.9 8.5*  GFRNONAA 33* 32*  GFRAA 38* 37*  ANIONGAP 10 10     Hematology Recent Labs  Lab 05-07-18 1245 May 07, 2018 2327  WBC 8.8 6.9  RBC 4.21* 3.97*  HGB 12.5* 11.6*  HCT 41.1 38.2*  MCV 97.6 96.2  MCH 29.7 29.2  MCHC 30.4 30.4  RDW 15.4 15.3  PLT 127* 132*    Cardiac EnzymesNo results for input(s): TROPONINI in the last 168 hours.  Recent Labs  Lab  2018/01/30 1256  TROPIPOC 0.02     BNP Recent Labs  Lab 2018/01/30 1624  BNP 1,380.5*     DDimer No results for input(s): DDIMER in the last 168 hours.   Radiology    Dg Chest Port 1 View  Result Date: 02/22/18 CLINICAL DATA:  History of atrial fibrillation.  Abnormal echo. EXAM: PORTABLE CHEST 1 VIEW COMPARISON:  March 06, 2007 FINDINGS: The cardiac silhouette appears enlarged although is poorly evaluated due to portable technique. No pneumothorax. The lungs are clear. No other acute abnormalities. IMPRESSION: The cardiac silhouette appears enlarged but is poorly evaluated due to portable technique. No other acute abnormalities. Electronically Signed   By: Gerome Samavid  Williams III M.D   On: 02/22/18 12:33    Cardiac Studies   Echo 04/2018: LVEF 21%.  Global hypokinesis.  3+MR/TR  Patient Profile     Ross Sweeney is an [age]M with chronic atrial fibrillation, diabetes and  hyperlipidemia here with newly diagnosed acute systolic heart failure and atrial fibrillation with RVR.    Assessment & Plan    # Atrial fibrillation with RVR: Rates have been in the 120s at home for a long time.  Switch metoprolol to succinate.  Continue digoxin (started this admission) and check level on 12/20.  He is on warfarin at home.  Warfarin is on hold.  INR is 2.3.  We will start heparin once his INR drops below 2.  If his stress test is negative tomorrow then we can resume warfarin as he will not need left heart cath.  # Acute systolic and diastolic heart failure: LVEF 21%.  Likely tachycardia related.  Rate control as above.  Given his renal function [facility].  Continue diuresis with lasix 40mg  IV bid.  He was net -2L yesterday.  If his blood pressure allows we will add Entresto or an ARB.  # Mitral regurgitation: # Tricuspid regurgitation: 3+ regurgitation of both valves noted on his echo prior to admission.  This is likely functional.  We were unable to view his images and only have a written report of his echo.  Plan to repeat his echo once he is euvolemic.  Likely no plans for intervention on his valve at this time until we see if his systolic function improves with rate control.       For questions or updates, please contact [facility] Please consult [email or web address] for contact info under        Signed, Chilton Siiffany Pembina, MD  04/18/2018, 9:03 AM

## 2018-04-18 NOTE — Care Management Note (Signed)
Case Management Note  Patient Details  Name: Ross Sweeney MRN: [id number] Date of Birth: 07/03/1935  Subjective/Objective:   CHF                Action/Plan: Patient lives at home alone, continues to drive;PCP: Oval Linseyondiego, [name], MD; has private insurance with Medicare / [employer] with prescription drug coverage; pharmacy of choice is [facility] [place]; no DME at home; he cooks and states that he use a little salt but is trying to decrease his sodium intake; has scales at home and knows to weigh himself daily; no needs identified at this time; CM will continue to follow for progression of care.   Expected Discharge Date:   possibly 04/22/2018               Expected Discharge Plan:  Home/Self Care  In-House Referral:   Nutritional   Discharge planning Services  CM Consult  Status of Service:  In process, will continue to follow  Reola MosherChandler, Young Mulvey L, RN,MHA,BSN [phone] 04/18/2018, 9:51 AM

## 2018-04-18 NOTE — Progress Notes (Signed)
ANTICOAGULATION CONSULT NOTE - Follow Up Consult  Pharmacy Consult for Heparin when INR <2;  Coumadin on hold Indication: atrial fibrillation  Not on File  Patient Measurements: Height: 5\' 7"  (170.2 cm) Weight: 195 lb 5.2 oz (88.6 kg) IBW/kg (Calculated) : 66.1 Heparin Dosing Weight: 74 kg  Vital Signs: Temp: 97.5 F (36.4 C) (12/17 0754) Temp Source: Oral (12/17 0754) BP: 112/89 (12/17 0823) Pulse Rate: 91 (12/17 0823)  Labs: Recent Labs    04/16/2018 1245 04/23/2018 2327 04/18/18 0423  HGB 12.5* 11.6*  --   HCT 41.1 38.2*  --   PLT 127* 132*  --   LABPROT 25.4*  --  25.4*  INR 2.35  --  2.35  CREATININE 1.87*  --  1.92*    Estimated Creatinine Clearance: 32.6 mL/min (A) (by C-G formula based on SCr of 1.92 mg/dL (H)).  Assessment:   82 yr old male presented on 12/16 with worsening LE edema and abnormal echo.  On Coumadin prior to admission for atrial fibrillation.  Coumadin held in case cardiac cath is needed.  Plan stress test on 12/18.     To begin IV heparin when INR < 2.     INR 2.35 on 12/16 and 2.35 again this am.      Home Coumadin regimen:  5 mg daily.  Last dose 12/15.  Goal of Therapy:  INR 2-3 Heparin level 0.3-0.7 units/ml Monitor platelets by anticoagulation protocol: Yes   Plan:   No heparin today.  Daily PT/INR.  Coumadin on hold.  Noted plan to resume Coumadin if stress test is negative and cath is not needed.  Dennie FettersEgan, Aydon Swamy Donovan, RPh Pager: [phone] or phone: [phone] 04/18/2018,10:27 AM

## 2018-04-19 ENCOUNTER — Inpatient Hospital Stay (HOSPITAL_COMMUNITY): Payer: Medicare Other

## 2018-04-19 DIAGNOSIS — I509 Heart failure, unspecified: Secondary | ICD-10-CM

## 2018-04-19 LAB — NM MYOCAR MULTI W/SPECT W/WALL MOTION / EF
Estimated workload: 1 METS
Exercise duration (min): 5 min
Exercise duration (sec): 20 s
MPHR: 140 {beats}/min
Peak HR: 111 {beats}/min
Percent HR: 79 %
Rest HR: 90 {beats}/min

## 2018-04-19 LAB — URINE CULTURE

## 2018-04-19 LAB — CBC
HCT: 36 % — ABNORMAL LOW (ref 39.0–52.0)
Hemoglobin: 11.6 g/dL — ABNORMAL LOW (ref 13.0–17.0)
MCH: 30.1 pg (ref 26.0–34.0)
MCHC: 32.2 g/dL (ref 30.0–36.0)
MCV: 93.3 fL (ref 80.0–100.0)
Platelets: 126 10*3/uL — ABNORMAL LOW (ref 150–400)
RBC: 3.86 MIL/uL — AB (ref 4.22–5.81)
RDW: 15 % (ref 11.5–15.5)
WBC: 6.5 10*3/uL (ref 4.0–10.5)
nRBC: 0 % (ref 0.0–0.2)

## 2018-04-19 LAB — PROTIME-INR
INR: 1.93
PROTHROMBIN TIME: 21.8 s — AB (ref 11.4–15.2)

## 2018-04-19 LAB — MAGNESIUM: Magnesium: 1.5 mg/dL — ABNORMAL LOW (ref 1.7–2.4)

## 2018-04-19 LAB — GLUCOSE, CAPILLARY
GLUCOSE-CAPILLARY: 159 mg/dL — AB (ref 70–99)
Glucose-Capillary: 107 mg/dL — ABNORMAL HIGH (ref 70–99)
Glucose-Capillary: 167 mg/dL — ABNORMAL HIGH (ref 70–99)

## 2018-04-19 LAB — BASIC METABOLIC PANEL
Anion gap: 9 (ref 5–15)
BUN: 38 mg/dL — ABNORMAL HIGH (ref 8–23)
CO2: 27 mmol/L (ref 22–32)
Calcium: 8.1 mg/dL — ABNORMAL LOW (ref 8.9–10.3)
Chloride: 102 mmol/L (ref 98–111)
Creatinine, Ser: 1.73 mg/dL — ABNORMAL HIGH (ref 0.61–1.24)
GFR calc Af Amer: 42 mL/min — ABNORMAL LOW (ref >60)
GFR calc non Af Amer: 36 mL/min — ABNORMAL LOW (ref >60)
Glucose, Bld: 122 mg/dL — ABNORMAL HIGH (ref 70–99)
Potassium: 3.9 mmol/L (ref 3.5–5.1)
Sodium: 138 mmol/L (ref 135–145)

## 2018-04-19 LAB — HEPARIN LEVEL (UNFRACTIONATED): Heparin Unfractionated: 0.23 IU/mL — ABNORMAL LOW (ref 0.30–0.70)

## 2018-04-19 MED ORDER — MAGNESIUM SULFATE 4 GM/100ML IV SOLN
4.0000 g | Freq: Once | INTRAVENOUS | Status: AC
  Administered 2018-04-19: 4 g via INTRAVENOUS
  Filled 2018-04-19: qty 100

## 2018-04-19 MED ORDER — REGADENOSON 0.4 MG/5ML IV SOLN
INTRAVENOUS | Status: AC
  Administered 2018-04-19: 0.4 mg
  Filled 2018-04-19: qty 5

## 2018-04-19 MED ORDER — REGADENOSON 0.4 MG/5ML IV SOLN
0.4000 mg | Freq: Once | INTRAVENOUS | Status: DC
  Filled 2018-04-19: qty 5

## 2018-04-19 MED ORDER — TECHNETIUM TC 99M TETROFOSMIN IV KIT
30.0000 | PACK | Freq: Once | INTRAVENOUS | Status: AC | PRN
  Administered 2018-04-19: 30 via INTRAVENOUS

## 2018-04-19 MED ORDER — TECHNETIUM TC 99M TETROFOSMIN IV KIT
10.0000 | PACK | Freq: Once | INTRAVENOUS | Status: AC | PRN
  Administered 2018-04-19: 10 via INTRAVENOUS

## 2018-04-19 MED ORDER — WARFARIN SODIUM 7.5 MG PO TABS
7.5000 mg | ORAL_TABLET | Freq: Once | ORAL | Status: AC
  Administered 2018-04-19: 7.5 mg via ORAL
  Filled 2018-04-19: qty 1

## 2018-04-19 MED ORDER — WARFARIN - PHARMACIST DOSING INPATIENT
Freq: Every day | Status: DC
  Administered 2018-04-21 – 2018-04-22 (×2): 1

## 2018-04-19 MED ORDER — HEPARIN (PORCINE) 25000 UT/250ML-% IV SOLN
1100.0000 [IU]/h | INTRAVENOUS | Status: DC
  Administered 2018-04-19: 1100 [IU]/h via INTRAVENOUS
  Filled 2018-04-19: qty 250

## 2018-04-19 NOTE — Progress Notes (Signed)
ANTICOAGULATION CONSULT NOTE - Follow Up Consult  Pharmacy Consult for heparin Indication: atrial fibrillation  Labs: Recent Labs    04-10-2018 1245 04-10-2018 2327 04/18/18 0423 04/19/18 0349  HGB 12.5* 11.6*  --  11.6*  HCT 41.1 38.2*  --  36.0*  PLT 127* 132*  --  126*  LABPROT 25.4*  --  25.4* 21.8*  INR 2.35  --  2.35 1.93  CREATININE 1.87*  --  1.92* 1.73*    Assessment: 82yo male to start heparin bridge now that INR <2.  Goal of Therapy:  Heparin level 0.3-0.7 units/ml Monitor platelets by anticoagulation protocol: Yes   Plan:  Will begin heparin gtt at 1100 units/hr and monitor heparin levels and CBC.  Vernard GamblesVeronda Nikol Lemar, PharmD, BCPS  04/19/2018,5:35 AM

## 2018-04-19 NOTE — Progress Notes (Signed)
   Vickki HearingBobby L Artley presented for a nuclear stress test today.  No immediate complications.  Stress imaging is pending at this time.  Preliminary EKG findings may be listed in the chart, but the stress test result will not be finalized until perfusion imaging is complete.   Georgie ChardJill Renton Berkley, NP-C 04/19/2018, 10:47 AM

## 2018-04-19 NOTE — Progress Notes (Signed)
Progress Note  Patient Name: Ross Sweeney Date of Encounter: 04/19/2018  Primary Cardiologist: Dr. Duke Salviaandolph   Subjective   Pt seen in nuclear medicine. Tolerated study well, no chest pain or SOB.   Inpatient Medications    Scheduled Meds: . aspirin EC  81 mg Oral Daily  . digoxin  0.0625 mg Oral Daily  . furosemide  40 mg Intravenous BID  . metoprolol succinate  200 mg Oral Daily  . regadenoson  0.4 mg Intravenous Once  . rosuvastatin  20 mg Oral q1800  . sodium chloride flush  3 mL Intravenous Q12H   Continuous Infusions: . sodium chloride 250 mL (04/19/18 0618)  . heparin 1,100 Units/hr (04/19/18 0620)   PRN Meds: sodium chloride, acetaminophen, ondansetron (ZOFRAN) IV, sodium chloride flush   Vital Signs    Vitals:   04/19/18 1018 04/19/18 1023 04/19/18 1026 04/19/18 1028  BP: 133/81 122/81 120/78 128/75  Pulse:      Resp:      Temp:      TempSrc:      SpO2:      Weight:      Height:        Intake/Output Summary (Last 24 hours) at 04/19/2018 1032 Last data filed at 04/19/2018 [id number] Gross per 24 hour  Intake 580 ml  Output 4650 ml  Net -4070 ml   Filed Weights   03/19/2018 2013 04/18/18 0441 04/19/18 0355  Weight: 88.9 kg 88.6 kg 80.8 kg    Physical Exam   General: Well developed, well nourished, NAD Skin: Warm, dry, intact  Neck: Negative for carotid bruits. No JVD Lungs:Clear to ausculation bilaterally. No wheezes, rales, or rhonchi. Breathing is unlabored. Cardiovascular: Irregularly irregular with S1 S2. No murmurs, rubs, gallops, or LV heave appreciated. MSK: Strength and tone appear normal for age. 5/5 in all extremities Extremities: No edema. No clubbing or cyanosis. DP/PT pulses 2+ bilaterally Neuro: Alert and oriented. No focal deficits. No facial asymmetry. MAE spontaneously. Psych: Responds to questions appropriately with normal affect.    Labs    Chemistry Recent Labs  Lab 03/19/2018 1245 04/18/18 0423 04/19/18 0349  NA 140  140 138  K 5.3* 4.9 3.9  CL 111 107 102  CO2 19* 23 27  GLUCOSE 111* 103* 122*  BUN 49* 47* 38*  CREATININE 1.87* 1.92* 1.73*  CALCIUM 8.9 8.5* 8.1*  GFRNONAA 33* 32* 36*  GFRAA 38* 37* 42*  ANIONGAP 10 10 9      Hematology Recent Labs  Lab 03/19/2018 1245 03/19/2018 2327 04/19/18 0349  WBC 8.8 6.9 6.5  RBC 4.21* 3.97* 3.86*  HGB 12.5* 11.6* 11.6*  HCT 41.1 38.2* 36.0*  MCV 97.6 96.2 93.3  MCH 29.7 29.2 30.1  MCHC 30.4 30.4 32.2  RDW 15.4 15.3 15.0  PLT 127* 132* 126*    Cardiac EnzymesNo results for input(s): TROPONINI in the last 168 hours.  Recent Labs  Lab 03/19/2018 1256  TROPIPOC 0.02     BNP Recent Labs  Lab 03/19/2018 1624  BNP 1,380.5*     DDimer No results for input(s): DDIMER in the last 168 hours.   Radiology    Dg Chest Port 1 View  Result Date: Feb 04, 2018 CLINICAL DATA:  History of atrial fibrillation.  Abnormal echo. EXAM: PORTABLE CHEST 1 VIEW COMPARISON:  March 06, 2007 FINDINGS: The cardiac silhouette appears enlarged although is poorly evaluated due to portable technique. No pneumothorax. The lungs are clear. No other acute abnormalities. IMPRESSION: The cardiac silhouette  appears enlarged but is poorly evaluated due to portable technique. No other acute abnormalities. Electronically Signed   By: Gerome Sam III M.D   On: 2018-04-22 12:33   Telemetry    04/19/18 AF  - Personally Reviewed  ECG    No new tracings as of 04/19/18- Personally Reviewed  Cardiac Studies   Echocardiogram: 04/2018: LVEF 21%.  Global hypokinesis.  3+MR/TR  Eugenie Birks Stress Test 04/19/18: Pending   Patient Profile     82 y.o. male with a hx of chronic atrial fibrillation, diabetes and hyperlipidemia here with newly diagnosed acute systolic heart failure and atrial fibrillation with RVR.   Assessment & Plan    1. AF with RVR: -Rates better controlled today in nuclear medicine, HR 90's  -Continue digoxin (started this admission) and check level on  12/20 -Warfarin on hold, Hep gtt in place  -INR, 1.93 today  -If his stress test is negative tomorrow then we can resume warfarin as he will not need left heart cath.  2. Acute systolic and diastolic heart failure: -LVEF 96% per echocardiogram  -Lexiscan Myoview completed today with pending results.  -If abnormal, will need further workup with cath  -Weight, 178.2lb today, 196lb on Apr 22, 2018? -I&O, net negative 6.8L since admission  -Entresto/ARB as BP allows -Continue ASA, lasix 40, BB, statin in the setting of HF and possible progression of CAD   3. MR/TR: -3+ MR/TR on echo this admission  -Plan is to repeat echo as imagining was unavailable, only written report -Likely no plans for intervention on his valve at this time until we see if his systolic function improves with rate control.   Signed, Georgie Chard NP-C [facility] Pager: [phone] 04/19/2018, 10:32 AM     For questions or updates, please contact   Please consult [email or web address] for contact info under Cardiology/STEMI.

## 2018-04-19 NOTE — Progress Notes (Addendum)
PROGRESS NOTE  Ross Sweeney ZOX:[id number] DOB: [birth year] DOA: April 29, 2018 PCP: Oval Linsey, MD  HPI/Recap of past 24 hours: Ross Sweeney is a 82 y.o. male with medical history significant of DM; afib; and CHF presenting after being sent by PCP. Dr. Janna Arch did an echo this AM and said since the last one - 8-9 years ago - something caused the heart muscle to weaken and so he sent him in.  He has afib and it "runs wide open all the time."  The patinet isn't sure why he did the echo, but he does have DOE and LE edema.  He can not walk through his house without feeling SOB.  Symptoms have been going on "this year."  Occasional nonproductive cough.  +wheezing, occasional, most days.  +LE edema x 3-4 weeks.  No weight gain since the edema started but his weight is up prior.  He had been started "on a pill to take it off but I told him this morning it wasn't taking it off."  His weight this AM was 196, usual weight 170-185.  No orthopnea.  No PND.  He wakes up thirsty.  He only feels SOB with ambulation.  04/18/18: Patient seen and examined at bedside.  Denies any chest pain or palpitations however he admits to dyspnea when he ambulates.  04/19/2018: Patient seen and examined at bedside.  No acute events overnight.  Had a active stress test done today.  Ongoing adjustment of his cardiac medications by cardiology.  No new complaints.  Assessment/Plan: Principal Problem:   Acute on chronic systolic CHF (congestive heart failure) (HCC) Active Problems:   Diabetes mellitus without complication (HCC)   A-fib (HCC)   CKD (chronic kidney disease), stage III (HCC)  Acute on chronic combined diastolic and systolic CHF Prior history of LVEF 45% Had a reported echo with LVEF of 21% Chest x-ray done on Apr 29, 2018 independently reviewed revealed cardiomegaly with mild increase in pulmonary vascularity. Continue Lasix 40 mg twice daily Continue strict I's and O's Continue to monitor volume  status Continue to diurese as recommended by cardiology Negative stress test today 04/19/2018  Chronic A. fib with uncontrolled rate Cardiology has been consulted and following Rate is now controlled on Toprol-XL 200 mg daily Also on digoxin 0.0625 mg oral daily On Coumadin for primary CVA prophylaxis Coumadin is on hold today due to possible stress test in the morning Negative stress test Digoxin level in the morning per cardiology  Mitral regurgitation/tricuspid regurgitation Cardiology suspects this is likely functional  Hyperlipidemia Continue Crestor  Type 2 diabetes A1c 6.1 Appears well controlled on metformin at home Sensitive insulin sliding scale Avoid hypoglycemia  Hypomagnesemia Magnesium 1.5 Repleted with IV magnesium supplement  CKD 3 Unknown baseline Presented with creatinine of 1.87 and GFR of 33 Creatinine today 1.73 with creatinine with GFR of 36 -8 L since admission Continue to monitor urine output  Subtherapeutic INR INR 1.93 Coumadin held due to stress test Pharmacy managing INR Repeat INR in the morning Defer to cardiology to resume Coumadin   DVT prophylaxis:  Coumadin Code Status:  DNR - Family Communication:  None at bedside Disposition Plan:   Home when cardiology signs off Consults called: Cardiology;     Objective: Vitals:   04/19/18 1023 04/19/18 1026 04/19/18 1028 04/19/18 1137  BP: 122/81 120/78 128/75 99/74  Pulse:    (!) 101  Resp:    16  Temp:    (!) 97.5 F (36.4 C)  TempSrc:  Oral  SpO2:    97%  Weight:      Height:        Intake/Output Summary (Last 24 hours) at 04/19/2018 1454 Last data filed at 04/19/2018 1332 Gross per 24 hour  Intake 580 ml  Output 5025 ml  Net -4445 ml   Filed Weights   04/08/2018 2013 04/18/18 0441 04/19/18 0355  Weight: 88.9 kg 88.6 kg 80.8 kg    Exam:  . General: 82 y.o. year-old male developed well-nourished in no acute distress.  Alert and oriented x3.   . Cardiovascular:  Irregular rate and rhythm with no rubs or gallops.  No JVD or thyromegaly noted.   Marland Kitchen. Respiratory: Clear to auscultation with no wheezes no rales.  Good inspiratory effort. . Abdomen: Soft nontender nondistended with normal bowel sounds x4 quadrants. . Musculoskeletal: No lower extremity edema. 2/4 pulses in all 4 extremities. . Skin: No ulcerative lesions noted or rashes . Psychiatry: Mood is appropriate for condition and setting   Data Reviewed: CBC: Recent Labs  Lab 04/06/2018 1245 04/10/2018 2327 04/19/18 0349  WBC 8.8 6.9 6.5  NEUTROABS  --  4.3  --   HGB 12.5* 11.6* 11.6*  HCT 41.1 38.2* 36.0*  MCV 97.6 96.2 93.3  PLT 127* 132* 126*   Basic Metabolic Panel: Recent Labs  Lab 04/03/2018 1245 04/18/18 0423 04/19/18 0349  NA 140 140 138  K 5.3* 4.9 3.9  CL 111 107 102  CO2 19* 23 27  GLUCOSE 111* 103* 122*  BUN 49* 47* 38*  CREATININE 1.87* 1.92* 1.73*  CALCIUM 8.9 8.5* 8.1*  MG  --   --  1.5*   GFR: Estimated Creatinine Clearance: 34.7 mL/min (A) (by C-G formula based on SCr of 1.73 mg/dL (H)). Liver Function Tests: No results for input(s): AST, ALT, ALKPHOS, BILITOT, PROT, ALBUMIN in the last 168 hours. No results for input(s): LIPASE, AMYLASE in the last 168 hours. No results for input(s): AMMONIA in the last 168 hours. Coagulation Profile: Recent Labs  Lab 04/09/2018 1245 04/18/18 0423 04/19/18 0349  INR 2.35 2.35 1.93   Cardiac Enzymes: No results for input(s): CKTOTAL, CKMB, CKMBINDEX, TROPONINI in the last 168 hours. BNP (last 3 results) No results for input(s): PROBNP in the last 8760 hours. HbA1C: Recent Labs    04/09/2018 1629  HGBA1C 6.1*   CBG: Recent Labs  Lab 04/18/18 1229 04/18/18 1646 04/18/18 2057 04/19/18 0746 04/19/18 1135  GLUCAP 135* 123* 139* 107* 159*   Lipid Profile: No results for input(s): CHOL, HDL, LDLCALC, TRIG, CHOLHDL, LDLDIRECT in the last 72 hours. Thyroid Function Tests: No results for input(s): TSH, T4TOTAL, FREET4,  T3FREE, THYROIDAB in the last 72 hours. Anemia Panel: No results for input(s): VITAMINB12, FOLATE, FERRITIN, TIBC, IRON, RETICCTPCT in the last 72 hours. Urine analysis: No results found for: COLORURINE, APPEARANCEUR, LABSPEC, PHURINE, GLUCOSEU, HGBUR, BILIRUBINUR, KETONESUR, PROTEINUR, UROBILINOGEN, NITRITE, LEUKOCYTESUR Sepsis Labs: @LABRCNTIP (procalcitonin:4,lacticidven:4)  ) Recent Results (from the past 240 hour(s))  Blood Culture (routine x 2)     Status: None (Preliminary result)   Collection Time: 04/05/2018  5:50 PM  Result Value Ref Range Status   Specimen Description BLOOD LEFT HAND  Final   Special Requests   Final    BOTTLES DRAWN AEROBIC AND ANAEROBIC Blood Culture results may not be optimal due to an excessive volume of blood received in culture bottles   Culture   Final    NO GROWTH 2 DAYS Performed [facility], [address] [place], [place] [address]    Report Status PENDING  Incomplete  Blood Culture (routine x 2)     Status: None (Preliminary result)   Collection Time: 04-30-18  5:50 PM  Result Value Ref Range Status   Specimen Description BLOOD RIGHT HAND  Final   Special Requests   Final    BOTTLES DRAWN AEROBIC AND ANAEROBIC Blood Culture results may not be optimal due to an excessive volume of blood received in culture bottles   Culture   Final    NO GROWTH 2 DAYS Performed at [facility], [address] [place], [place] [address]    Report Status PENDING  Incomplete  Urine culture     Status: Abnormal   Collection Time: Apr 30, 2018  9:42 PM  Result Value Ref Range Status   Specimen Description URINE, CATHETERIZED  Final   Special Requests   Final    NONE Performed at [facility], [address], [place], [place] [address]    Culture MULTIPLE SPECIES PRESENT, SUGGEST RECOLLECTION (A)  Final   Report Status 04/19/2018 FINAL  Final      Studies: Nm Myocar Multi W/spect W/wall Motion / Ef  Result Date: 04/19/2018 CLINICAL  DATA:  CAD evaluation EXAM: MYOCARDIAL IMAGING WITH SPECT (REST AND PHARMACOLOGIC-STRESS) GATED LEFT VENTRICULAR WALL MOTION STUDY LEFT VENTRICULAR EJECTION FRACTION TECHNIQUE: Standard myocardial SPECT imaging was performed after resting intravenous injection of 10 mCi Tc-100m tetrofosmin. Subsequently, intravenous infusion of Lexiscan was performed under the supervision of the Cardiology staff. At peak effect of the drug, 30 mCi Tc-96m tetrofosmin was injected intravenously and standard myocardial SPECT imaging was performed. Quantitative gated imaging was also performed to evaluate left ventricular wall motion, and estimate left ventricular ejection fraction. COMPARISON:  None. FINDINGS: Perfusion: No decreased activity in the left ventricle on stress imaging to suggest reversible ischemia or infarction. Wall Motion: Generalized hypokinesia Left Ventricular Ejection Fraction: 37 % End diastolic volume 152 ml End systolic volume 95 ml IMPRESSION: 1. No reversible ischemia or infarction. 2. Generalized hypokinesia. 3. Left ventricular ejection fraction 37% 4. Non invasive risk stratification*: Intermediate *2012 Appropriate Use Criteria for Coronary Revascularization Focused Update: J Am Coll Cardiol. 2012;59(9):857-881. http://content.dementiazones.com.aspx?articleid=[email or web address] Electronically Signed   By: Charlett Nose M.D.   On: 04/19/2018 13:09    Scheduled Meds: . aspirin EC  81 mg Oral Daily  . digoxin  0.0625 mg Oral Daily  . furosemide  40 mg Intravenous BID  . metoprolol succinate  200 mg Oral Daily  . regadenoson  0.4 mg Intravenous Once  . rosuvastatin  20 mg Oral q1800  . sodium chloride flush  3 mL Intravenous Q12H    Continuous Infusions: . sodium chloride 250 mL (04/19/18 0618)  . heparin 1,100 Units/hr (04/19/18 0620)     LOS: 2 days     Darlin Drop, MD [facility] Pager [phone]  If 7PM-7AM, please contact night-coverage [email or web address] Password  [email or web address] 04/19/2018, 2:54 PM

## 2018-04-19 NOTE — Progress Notes (Signed)
ANTICOAGULATION CONSULT NOTE - Follow Up Consult  Pharmacy Consult for Heparin > Coumadin Indication: atrial fibrillation  Not on File  Patient Measurements: Height: 5\' 7"  (170.2 cm) Weight: 178 lb 3.2 oz (80.8 kg)(scale a) IBW/kg (Calculated) : 66.1 Heparin Dosing Weight: 74 kg  Vital Signs: Temp: 97.5 F (36.4 C) (12/18 1137) Temp Source: Oral (12/18 1137) BP: 99/74 (12/18 1137) Pulse Rate: 101 (12/18 1137)  Labs: Recent Labs    05-23-2017 1245 05-23-2017 2327 04/18/18 0423 04/19/18 0349 04/19/18 1348  HGB 12.5* 11.6*  --  11.6*  --   HCT 41.1 38.2*  --  36.0*  --   PLT 127* 132*  --  126*  --   LABPROT 25.4*  --  25.4* 21.8*  --   INR 2.35  --  2.35 1.93  --   HEPARINUNFRC  --   --   --   --  0.23*  CREATININE 1.87*  --  1.92* 1.73*  --     Estimated Creatinine Clearance: 34.7 mL/min (A) (by C-G formula based on SCr of 1.73 mg/dL (H)).  Assessment:   82 yr old male presented on 12/16 with worsening LE edema and abnormal echo.  On Coumadin prior to admission for atrial fibrillation.  Coumadin held in case cardiac cath needed, stress test done today noted negative for ischemia.     IV heparin begun this am with INR down to 1.93.  Initial heparin level is low (0.23) on 1100 units/hr. To resume Coumadin and stop IV heparin per d/w Dr. Duke Salviaandolph.      INR therapeutic (2.35) on admit and 12/17, down to 1.93 today and expect further decrease by am, since no Coumadin x 2 days.      Home Coumadin regimen:  5 mg daily.  Last dose 12/15.  Goal of Therapy:  INR 2-3 Heparin level 0.3-0.7 units/ml Monitor platelets by anticoagulation protocol: Yes   Plan:   Resume Coumadin with 7.5 mg x 1 tonight.  Daily PT/INR.  Stop IV heparin.  Dennie Fettersgan, Terrall Bley Donovan, RPh Pager: [phone] or phone: [phone] 04/19/2018,4:37 PM

## 2018-04-20 LAB — CBC
HCT: 36.5 % — ABNORMAL LOW (ref 39.0–52.0)
Hemoglobin: 12 g/dL — ABNORMAL LOW (ref 13.0–17.0)
MCH: 30.7 pg (ref 26.0–34.0)
MCHC: 32.9 g/dL (ref 30.0–36.0)
MCV: 93.4 fL (ref 80.0–100.0)
NRBC: 0 % (ref 0.0–0.2)
Platelets: 143 10*3/uL — ABNORMAL LOW (ref 150–400)
RBC: 3.91 MIL/uL — ABNORMAL LOW (ref 4.22–5.81)
RDW: 15 % (ref 11.5–15.5)
WBC: 7.1 10*3/uL (ref 4.0–10.5)

## 2018-04-20 LAB — DIGOXIN LEVEL: Digoxin Level: 0.3 ng/mL — ABNORMAL LOW (ref 0.8–2.0)

## 2018-04-20 LAB — PROTIME-INR
INR: 1.5
Prothrombin Time: 18 seconds — ABNORMAL HIGH (ref 11.4–15.2)

## 2018-04-20 LAB — BASIC METABOLIC PANEL
Anion gap: 11 (ref 5–15)
BUN: 27 mg/dL — ABNORMAL HIGH (ref 8–23)
CO2: 32 mmol/L (ref 22–32)
CREATININE: 1.38 mg/dL — AB (ref 0.61–1.24)
Calcium: 7.6 mg/dL — ABNORMAL LOW (ref 8.9–10.3)
Chloride: 96 mmol/L — ABNORMAL LOW (ref 98–111)
GFR calc Af Amer: 56 mL/min — ABNORMAL LOW (ref >60)
GFR calc non Af Amer: 48 mL/min — ABNORMAL LOW (ref >60)
Glucose, Bld: 90 mg/dL (ref 70–99)
Potassium: 4 mmol/L (ref 3.5–5.1)
Sodium: 139 mmol/L (ref 135–145)

## 2018-04-20 LAB — MAGNESIUM: Magnesium: 1.7 mg/dL (ref 1.7–2.4)

## 2018-04-20 LAB — HEPARIN LEVEL (UNFRACTIONATED): Heparin Unfractionated: 0.62 IU/mL (ref 0.30–0.70)

## 2018-04-20 MED ORDER — HEPARIN BOLUS VIA INFUSION
3000.0000 [IU] | Freq: Once | INTRAVENOUS | Status: AC
  Administered 2018-04-20: 3000 [IU] via INTRAVENOUS
  Filled 2018-04-20: qty 3000

## 2018-04-20 MED ORDER — DIGOXIN 125 MCG PO TABS
0.1250 mg | ORAL_TABLET | Freq: Every day | ORAL | Status: DC
  Administered 2018-04-20 – 2018-04-23 (×4): 0.125 mg via ORAL
  Filled 2018-04-20 (×4): qty 1

## 2018-04-20 MED ORDER — WARFARIN SODIUM 7.5 MG PO TABS
7.5000 mg | ORAL_TABLET | Freq: Once | ORAL | Status: AC
  Administered 2018-04-20: 7.5 mg via ORAL
  Filled 2018-04-20: qty 1

## 2018-04-20 MED ORDER — MAGNESIUM SULFATE 2 GM/50ML IV SOLN
2.0000 g | Freq: Once | INTRAVENOUS | Status: AC
  Administered 2018-04-20: 2 g via INTRAVENOUS
  Filled 2018-04-20: qty 50

## 2018-04-20 MED ORDER — HEPARIN (PORCINE) 25000 UT/250ML-% IV SOLN
1000.0000 [IU]/h | INTRAVENOUS | Status: DC
  Administered 2018-04-20 – 2018-04-21 (×2): 1300 [IU]/h via INTRAVENOUS
  Administered 2018-04-22 – 2018-04-23 (×2): 1200 [IU]/h via INTRAVENOUS
  Filled 2018-04-20 (×4): qty 250

## 2018-04-20 NOTE — Progress Notes (Addendum)
PROGRESS NOTE  Ross Sweeney [id number] DOB: 01/06/1936 DOA: 04/12/2018 PCP: Oval Linseyondiego, [name], MD  HPI/Recap of past 24 hours: Ross Sweeney is a 82 y.o. male with medical history significant of DM; afib; and CHF presenting after being sent by PCP. Dr. Janna Sweeney did an echo this AM and said since the last one - 8-9 years ago - something caused the heart muscle to weaken and so he sent him in.  He has afib and it "runs wide open all the time."  The patinet isn't sure why he did the echo, but he does have DOE and LE edema.  He can not walk through his house without feeling SOB.  Symptoms have been going on "this year."  Occasional nonproductive cough.  +wheezing, occasional, most days.  +LE edema x 3-4 weeks.  No weight gain since the edema started but his weight is up prior.  He had been started "on a pill to take it off but I told him this morning it wasn't taking it off."  His weight this AM was 196, usual weight 170-185.  No orthopnea.  No PND.  He wakes up thirsty.  He only feels SOB with ambulation.  04/19/2018: Stress test done.  No new findings.  04/20/2018: Patient seen and examined at his bedside.  Denies any chest pain palpitation or dyspnea.  No acute events overnight.  Subtherapeutic INR 1.50.  Coumadin restarted.  Assessment/Plan: Principal Problem:   Acute on chronic systolic CHF (congestive heart failure) (HCC) Active Problems:   Diabetes mellitus without complication (HCC)   A-fib (HCC)   CKD (chronic kidney disease), stage III (HCC)  Acute on chronic combined diastolic and systolic CHF Prior history of LVEF 45% Had a reported echo with LVEF of 21% Chest x-ray done on 04/15/2018 independently reviewed revealed cardiomegaly with mild increase in pulmonary vascularity. Continue Lasix 40 mg twice daily Continue strict I's and O's Continue to monitor volum Continue to diurese as recommended by cardiology Negative stress test 04/19/2018  Chronic A. fib with uncontrolled  rate Cardiology following Rate is now controlled on Toprol-XL 200 mg daily Continue digoxin 0.125 mg oral daily Negative stress test Digoxin level subtherapeutic at 0.3  Subtherapeutic INR INR 1.50 Coumadin restarted by cardiology Pharmacy managing Coumadin INR goal between 2 and 3  Mitral regurgitation/tricuspid regurgitation Cardiology suspects this is likely functional  Hyperlipidemia Continue Crestor  Type 2 diabetes A1c 6.1 Appears well controlled on metformin at home Sensitive insulin sliding scale Avoid hypoglycemia  AKI on CKD 3 Baseline creatinine appears to be 1.38 continue with GFR of 48 Presented with creatinine of 1.87 and GFR of 33 Creatinine today 1.38 Continue to avoid nephrotoxic agents Repeat BMP in the morning  Hypomagnesemia Magnesium 1.7 Repleted with 2 g IV magnesium once  Subtherapeutic INR INR 1.93 Coumadin held due to stress test Pharmacy managing INR Repeat INR in the morning Defer to cardiology to resume Coumadin   DVT prophylaxis:  Coumadin Code Status:  DNR - Family Communication:  None at bedside Disposition Plan:   Home when cardiology signs off Consults called: Cardiology;     Objective: Vitals:   04/19/18 1920 04/20/18 0242 04/20/18 0404 04/20/18 1000  BP: 106/70  100/60 100/76  Pulse: 76  65 100  Resp: 18  18   Temp: 98.3 F (36.8 C)  98 F (36.7 C)   TempSrc: Oral  Oral   SpO2: 93%  96%   Weight:  77.9 kg    Height:  Intake/Output Summary (Last 24 hours) at 04/20/2018 1339 Last data filed at 04/20/2018 1335 Gross per 24 hour  Intake 764 ml  Output 4275 ml  Net -3511 ml   Filed Weights   04/18/18 0441 04/19/18 0355 04/20/18 0242  Weight: 88.6 kg 80.8 kg 77.9 kg    Exam:  . General: 82 y.o. year-old male well-developed well-nourished in no acute distress.  Alert and oriented x3.   . Cardiovascular: Irregular rate and rhythm with no rubs or gallops.  No JVD or thyromegaly noted. Marland Kitchen. Respiratory:  Clear to auscultation with no wheezes or rales.  Good inspiratory effort.  Abdomen: Soft nontender nondistended with normal bowel sounds x4 quadrants. . Musculoskeletal: No lower extremity edema. 2/4 pulses in all 4 extremities. . Skin: No ulcerative lesions noted or rashes . Psychiatry: Mood is appropriate for condition and setting   Data Reviewed: CBC: Recent Labs  Lab 04/13/2018 1245 04/11/2018 2327 04/19/18 0349 04/20/18 0345  WBC 8.8 6.9 6.5 7.1  NEUTROABS  --  4.3  --   --   HGB 12.5* 11.6* 11.6* 12.0*  HCT 41.1 38.2* 36.0* 36.5*  MCV 97.6 96.2 93.3 93.4  PLT 127* 132* 126* 143*   Basic Metabolic Panel: Recent Labs  Lab 04/07/2018 1245 04/18/18 0423 04/19/18 0349 04/20/18 0345  NA 140 140 138 139  K 5.3* 4.9 3.9 4.0  CL 111 107 102 96*  CO2 19* 23 27 32  GLUCOSE 111* 103* 122* 90  BUN 49* 47* 38* 27*  CREATININE 1.87* 1.92* 1.73* 1.38*  CALCIUM 8.9 8.5* 8.1* 7.6*  MG  --   --  1.5* 1.7   GFR: Estimated Creatinine Clearance: 39.9 mL/min (A) (by C-G formula based on SCr of 1.38 mg/dL (H)). Liver Function Tests: No results for input(s): AST, ALT, ALKPHOS, BILITOT, PROT, ALBUMIN in the last 168 hours. No results for input(s): LIPASE, AMYLASE in the last 168 hours. No results for input(s): AMMONIA in the last 168 hours. Coagulation Profile: Recent Labs  Lab 04/07/2018 1245 04/18/18 0423 04/19/18 0349 04/20/18 0345  INR 2.35 2.35 1.93 1.50   Cardiac Enzymes: No results for input(s): CKTOTAL, CKMB, CKMBINDEX, TROPONINI in the last 168 hours. BNP (last 3 results) No results for input(s): PROBNP in the last 8760 hours. HbA1C: Recent Labs    04/27/2018 1629  HGBA1C 6.1*   CBG: Recent Labs  Lab 04/18/18 1646 04/18/18 2057 04/19/18 0746 04/19/18 1135 04/19/18 1629  GLUCAP 123* 139* 107* 159* 167*   Lipid Profile: No results for input(s): CHOL, HDL, LDLCALC, TRIG, CHOLHDL, LDLDIRECT in the last 72 hours. Thyroid Function Tests: No results for input(s): TSH,  T4TOTAL, FREET4, T3FREE, THYROIDAB in the last 72 hours. Anemia Panel: No results for input(s): VITAMINB12, FOLATE, FERRITIN, TIBC, IRON, RETICCTPCT in the last 72 hours. Urine analysis: No results found for: COLORURINE, APPEARANCEUR, LABSPEC, PHURINE, GLUCOSEU, HGBUR, BILIRUBINUR, KETONESUR, PROTEINUR, UROBILINOGEN, NITRITE, LEUKOCYTESUR Sepsis Labs: @LABRCNTIP (procalcitonin:4,lacticidven:4)  ) Recent Results (from the past 240 hour(s))  Blood Culture (routine x 2)     Status: None (Preliminary result)   Collection Time: 04/28/2018  5:50 PM  Result Value Ref Range Status   Specimen Description BLOOD LEFT HAND  Final   Special Requests   Final    BOTTLES DRAWN AEROBIC AND ANAEROBIC Blood Culture results may not be optimal due to an excessive volume of blood received in culture bottles   Culture   Final    NO GROWTH 3 DAYS Performed at [facility], [address], [place], [place] [address]    Report Status PENDING  Incomplete  Blood Culture (routine x 2)     Status: None (Preliminary result)   Collection Time: 04/19/2018  5:50 PM  Result Value Ref Range Status   Specimen Description BLOOD RIGHT HAND  Final   Special Requests   Final    BOTTLES DRAWN AEROBIC AND ANAEROBIC Blood Culture results may not be optimal due to an excessive volume of blood received in culture bottles   Culture   Final    NO GROWTH 3 DAYS Performed at [facility], [address] [place], [place] [address]    Report Status PENDING  Incomplete  Urine culture     Status: Abnormal   Collection Time: 04/08/2018  9:42 PM  Result Value Ref Range Status   Specimen Description URINE, CATHETERIZED  Final   Special Requests   Final    NONE Performed at [facility], [address] [place], [place] [address]    Culture MULTIPLE SPECIES PRESENT, SUGGEST RECOLLECTION (A)  Final   Report Status 04/19/2018 FINAL  Final      Studies: No results found.  Scheduled Meds: . aspirin EC  81 mg Oral  Daily  . digoxin  0.125 mg Oral Daily  . furosemide  40 mg Intravenous BID  . metoprolol succinate  200 mg Oral Daily  . regadenoson  0.4 mg Intravenous Once  . rosuvastatin  20 mg Oral q1800  . sodium chloride flush  3 mL Intravenous Q12H  . warfarin  7.5 mg Oral ONCE-1800  . Warfarin - Pharmacist Dosing Inpatient   Does not apply q1800    Continuous Infusions: . sodium chloride 250 mL (04/19/18 0618)  . heparin 1,300 Units/hr (04/20/18 1152)     LOS: 3 days     Darlin Drop, MD [facility] Pager [phone]  If 7PM-7AM, please contact night-coverage [email or web address] Password [email or web address] 04/20/2018, 1:39 PM

## 2018-04-20 NOTE — Progress Notes (Signed)
Progress Note  Patient Name: Ross Sweeney Date of Encounter: 04/20/2018  Primary Cardiologist: No primary care provider on file.   Subjective   Feeling well.  Notes that he urinated a lot overnight.   Inpatient Medications    Scheduled Meds: . aspirin EC  81 mg Oral Daily  . digoxin  0.125 mg Oral Daily  . furosemide  40 mg Intravenous BID  . metoprolol succinate  200 mg Oral Daily  . regadenoson  0.4 mg Intravenous Once  . rosuvastatin  20 mg Oral q1800  . sodium chloride flush  3 mL Intravenous Q12H  . Warfarin - Pharmacist Dosing Inpatient   Does not apply q1800   Continuous Infusions: . sodium chloride 250 mL (04/19/18 0618)  . magnesium sulfate 1 - 4 g bolus IVPB 2 g (04/20/18 1020)   PRN Meds: sodium chloride, acetaminophen, ondansetron (ZOFRAN) IV, sodium chloride flush   Vital Signs    Vitals:   04/19/18 1137 04/19/18 1920 04/20/18 0242 04/20/18 0404  BP: 99/74 106/70  100/60  Pulse: (!) 101 76  65  Resp: 16 18  18   Temp: (!) 97.5 F (36.4 C) 98.3 F (36.8 C)  98 F (36.7 C)  TempSrc: Oral Oral  Oral  SpO2: 97% 93%  96%  Weight:   77.9 kg   Height:        Intake/Output Summary (Last 24 hours) at 04/20/2018 1027 Last data filed at 04/20/2018 0859 Gross per 24 hour  Intake 284 ml  Output 4125 ml  Net -3841 ml   Filed Weights   04/18/18 0441 04/19/18 0355 04/20/18 0242  Weight: 88.6 kg 80.8 kg 77.9 kg    Telemetry    Atrial fibrillation.  Rate mostly <100 bpm.  4 beats NSVT. - Personally Reviewed  ECG    n/a - Personally Reviewed  Physical Exam   VS:  BP 100/60 (BP Location: Left Arm)   Pulse 65   Temp 98 F (36.7 C) (Oral)   Resp 18   Ht 5\' 7"  (1.702 m)   Wt 77.9 kg Comment: scale a  SpO2 96%   BMI 26.91 kg/m  , BMI Body mass index is 26.91 kg/m. GENERAL:  Well appearing HEENT: Pupils equal round and reactive, fundi not visualized, oral mucosa unremarkable NECK:  + jugular venous distention, waveform within normal limits,  carotid upstroke brisk and symmetric, no bruits, no thyromegaly LYMPHATICS:  No cervical adenopathy HEART:  Irregularly irregular.  PMI not displaced or sustained,S1 and S2 within normal limits, no S3, no S4, no clicks, no rubs, III/VI systolic murmurs ABD:  Flat, positive bowel sounds normal in frequency in pitch, no bruits, no rebound, no guarding, no midline pulsatile mass, no hepatomegaly, no splenomegaly EXT:  2 plus pulses throughout, 2+ LE edema to mid tibia bilaterally, no cyanosis no clubbing SKIN:  No rashes no nodules NEURO:  Cranial nerves II through XII grossly intact, motor grossly intact throughout [facility]:  Cognitively intact, oriented to person place and time   Labs    Chemistry Recent Labs  Lab 04/18/18 0423 04/19/18 0349 04/20/18 0345  NA 140 138 139  K 4.9 3.9 4.0  CL 107 102 96*  CO2 23 27 32  GLUCOSE 103* 122* 90  BUN 47* 38* 27*  CREATININE 1.92* 1.73* 1.38*  CALCIUM 8.5* 8.1* 7.6*  GFRNONAA 32* 36* 48*  GFRAA 37* 42* 56*  ANIONGAP 10 9 11      Hematology Recent Labs  Lab 04/20/2018 2327 04/19/18  [id number] 04/20/18 0345  WBC 6.9 6.5 7.1  RBC 3.97* 3.86* 3.91*  HGB 11.6* 11.6* 12.0*  HCT 38.2* 36.0* 36.5*  MCV 96.2 93.3 93.4  MCH 29.2 30.1 30.7  MCHC 30.4 32.2 32.9  RDW 15.3 15.0 15.0  PLT 132* 126* 143*    Cardiac EnzymesNo results for input(s): TROPONINI in the last 168 hours.  Recent Labs  Lab Sep 28, 2017 1256  TROPIPOC 0.02     BNP Recent Labs  Lab Sep 28, 2017 1624  BNP 1,380.5*     DDimer No results for input(s): DDIMER in the last 168 hours.   Radiology    Nm Myocar Multi W/spect W/wall Motion / Ef  Result Date: 04/19/2018 CLINICAL DATA:  CAD evaluation EXAM: MYOCARDIAL IMAGING WITH SPECT (REST AND PHARMACOLOGIC-STRESS) GATED LEFT VENTRICULAR WALL MOTION STUDY LEFT VENTRICULAR EJECTION FRACTION TECHNIQUE: Standard myocardial SPECT imaging was performed after resting intravenous injection of 10 mCi Tc-6471m tetrofosmin. Subsequently,  intravenous infusion of Lexiscan was performed under the supervision of the Cardiology staff. At peak effect of the drug, 30 mCi Tc-6971m tetrofosmin was injected intravenously and standard myocardial SPECT imaging was performed. Quantitative gated imaging was also performed to evaluate left ventricular wall motion, and estimate left ventricular ejection fraction. COMPARISON:  None. FINDINGS: Perfusion: No decreased activity in the left ventricle on stress imaging to suggest reversible ischemia or infarction. Wall Motion: Generalized hypokinesia Left Ventricular Ejection Fraction: 37 % End diastolic volume 152 ml End systolic volume 95 ml IMPRESSION: 1. No reversible ischemia or infarction. 2. Generalized hypokinesia. 3. Left ventricular ejection fraction 37% 4. Non invasive risk stratification*: Intermediate *2012 Appropriate Use Criteria for Coronary Revascularization Focused Update: J Am Coll Cardiol. 2012;59(9):857-881. http://content.dementiazones.comonlinejacc.org/article.aspx?articleid=1201161 Electronically Signed   By: Charlett NoseKevin  Dover M.D.   On: 04/19/2018 13:09    Cardiac Studies   Echo 04/2018: LVEF 21%.  Global hypokinesis.  3+MR/TR  Patient Profile     Ross Sweeney is an [age]M with chronic atrial fibrillation, diabetes and hyperlipidemia here with newly diagnosed acute systolic heart failure and atrial fibrillation with RVR.    Assessment & Plan    # Atrial fibrillation with RVR: Rates have been in the 120s at home for a long time.  Metoprolol tartrate was switched to succinate.  Digoxin started this admission.  Level is 0.3 today and rates are often >100 bpm.  Increase dose to 0.125mg .  Repeat level in 3 days.  Warfarin was restarted.   Continue digoxin (started this admission) and check level on 12/20.  He is on warfarin at home.  Warfarin is on hold.  INR is 2.3.    # Acute systolic and diastolic heart failure: LVEF 21%.  Likely tachycardia related.  Rate control as above.  Lexiscan Myoview was negative for  ischemia.  Continue diuresis with lasix 40mg  IV bid.  Hopefully we can switch to oral tomorrow.   If his blood pressure allows we will add Entresto or an ARB.  Renal function is significantly improved with diuresis.  # Mitral regurgitation: # Tricuspid regurgitation: 3+ regurgitation of both valves noted on his echo prior to admission.  This is likely functional.  We were unable to view his images and only have a written report of his echo.  Plan to repeat his echo once he is euvolemic.  Likely no plans for intervention on his valve at this time until we see if his systolic function improves with rate control.       For questions or updates, please contact [facility] Please consult  [email or web address] for contact info under        Signed, Chilton Si, MD  04/20/2018, 10:27 AM

## 2018-04-20 NOTE — Care Management Important Message (Signed)
Important Message  Patient Details  Name: Ross Sweeney MRN: [id number] Date of Birth: 02/17/1936   Medicare Important Message Given:  Yes    Kierre Barton P Linell Shawn 04/20/2018, 11:30 AM

## 2018-04-20 NOTE — Progress Notes (Signed)
ANTICOAGULATION CONSULT NOTE - Follow Up Consult  Pharmacy Consult for Heparin > Coumadin Indication: atrial fibrillation  Not on File  Patient Measurements: Height: 5\' 7"  (170.2 cm) Weight: 171 lb 12.8 oz (77.9 kg)(scale a) IBW/kg (Calculated) : 66.1 Heparin Dosing Weight: 74 kg  Vital Signs: Temp: 98.2 F (36.8 C) (12/19 1913) Temp Source: Oral (12/19 1913) BP: 91/50 (12/19 1913) Pulse Rate: 90 (12/19 1913)  Labs: Recent Labs    04/16/2018 2327 04/18/18 0423 04/19/18 0349 04/19/18 1348 04/20/18 0345 04/20/18 2023  HGB 11.6*  --  11.6*  --  12.0*  --   HCT 38.2*  --  36.0*  --  36.5*  --   PLT 132*  --  126*  --  143*  --   LABPROT  --  25.4* 21.8*  --  18.0*  --   INR  --  2.35 1.93  --  1.50  --   HEPARINUNFRC  --   --   --  0.23*  --  0.62  CREATININE  --  1.92* 1.73*  --  1.38*  --     Estimated Creatinine Clearance: 39.9 mL/min (A) (by C-G formula based on SCr of 1.38 mg/dL (H)).  . sodium chloride 250 mL (04/19/18 0618)  . heparin 1,300 Units/hr (04/20/18 1152)     Assessment:   82 yr old male presented on 12/16 with worsening LE edema and abnormal echo.  On Coumadin prior to admission for atrial fibrillation.  Coumadin held in case cardiac cath needed, stress test done today noted negative for ischemia.     IV heparin begun this am with INR down to 1.93.  Initial heparin level is low (0.23) on 1100 units/hr. To resume Coumadin and stop IV heparin per d/w Dr. Duke Salviaandolph.      INR therapeutic (2.35) on admit and 12/17, down to 1.93 today and expect further decrease by am, since no Coumadin x 2 days.      Home Coumadin regimen:  5 mg daily.  Last dose 12/15.  PM F/u: heparin level at goal this evening, no bleeding or complications noted.  Goal of Therapy:  INR 2-3 Heparin level 0.3-0.7 units/ml Monitor platelets by anticoagulation protocol: Yes   Plan:  Continue heparin at current rate of 1300 units/hr. Confirm heparin level with AM labs. Daily heparin  level and CBC.  Jenetta DownerJessica Khaleem Burchill, Pharm D, BCPS, [facility] Clinical Pharmacist Phone [phone]  04/20/2018 9:13 PM

## 2018-04-21 ENCOUNTER — Inpatient Hospital Stay (HOSPITAL_COMMUNITY): Payer: Medicare Other

## 2018-04-21 DIAGNOSIS — I351 Nonrheumatic aortic (valve) insufficiency: Secondary | ICD-10-CM

## 2018-04-21 DIAGNOSIS — I340 Nonrheumatic mitral (valve) insufficiency: Secondary | ICD-10-CM

## 2018-04-21 DIAGNOSIS — I370 Nonrheumatic pulmonary valve stenosis: Secondary | ICD-10-CM

## 2018-04-21 DIAGNOSIS — I361 Nonrheumatic tricuspid (valve) insufficiency: Secondary | ICD-10-CM

## 2018-04-21 LAB — ECHOCARDIOGRAM COMPLETE
Height: 67 in
Weight: 2659.2 oz

## 2018-04-21 LAB — BASIC METABOLIC PANEL
Anion gap: 11 (ref 5–15)
BUN: 20 mg/dL (ref 8–23)
CO2: 32 mmol/L (ref 22–32)
Calcium: 7.6 mg/dL — ABNORMAL LOW (ref 8.9–10.3)
Chloride: 95 mmol/L — ABNORMAL LOW (ref 98–111)
Creatinine, Ser: 1.35 mg/dL — ABNORMAL HIGH (ref 0.61–1.24)
GFR calc Af Amer: 57 mL/min — ABNORMAL LOW (ref >60)
GFR, EST NON AFRICAN AMERICAN: 49 mL/min — AB (ref >60)
Glucose, Bld: 90 mg/dL (ref 70–99)
POTASSIUM: 3.3 mmol/L — AB (ref 3.5–5.1)
Sodium: 138 mmol/L (ref 135–145)

## 2018-04-21 LAB — CBC
HCT: 38.6 % — ABNORMAL LOW (ref 39.0–52.0)
Hemoglobin: 12.1 g/dL — ABNORMAL LOW (ref 13.0–17.0)
MCH: 29.2 pg (ref 26.0–34.0)
MCHC: 31.3 g/dL (ref 30.0–36.0)
MCV: 93 fL (ref 80.0–100.0)
NRBC: 0 % (ref 0.0–0.2)
Platelets: 134 10*3/uL — ABNORMAL LOW (ref 150–400)
RBC: 4.15 MIL/uL — AB (ref 4.22–5.81)
RDW: 15 % (ref 11.5–15.5)
WBC: 6.5 10*3/uL (ref 4.0–10.5)

## 2018-04-21 LAB — HEPARIN LEVEL (UNFRACTIONATED): Heparin Unfractionated: 0.73 IU/mL — ABNORMAL HIGH (ref 0.30–0.70)

## 2018-04-21 LAB — PROTIME-INR
INR: 1.59
Prothrombin Time: 18.8 seconds — ABNORMAL HIGH (ref 11.4–15.2)

## 2018-04-21 MED ORDER — WARFARIN SODIUM 5 MG PO TABS
5.0000 mg | ORAL_TABLET | Freq: Once | ORAL | Status: AC
  Administered 2018-04-21: 5 mg via ORAL
  Filled 2018-04-21: qty 1

## 2018-04-21 MED ORDER — POTASSIUM CHLORIDE CRYS ER 20 MEQ PO TBCR
40.0000 meq | EXTENDED_RELEASE_TABLET | Freq: Two times a day (BID) | ORAL | Status: AC
  Administered 2018-04-21 (×2): 40 meq via ORAL
  Filled 2018-04-21 (×2): qty 2

## 2018-04-21 NOTE — Progress Notes (Signed)
PT Cancellation Note  Patient Details Name: Ross HearingBobby L Zavalza MRN: [id number] DOB: 03/13/1936   Cancelled Treatment:    Reason Eval/Treat Not Completed: PT screened, no needs identified, will sign off; spoke with pt who was standing in his room donning gown for a walk in hallway.  Reports no changes since last PT visit on Tuesday.  Feels no PT needs.  Patient seen in hallway ambulating without device.  Will sign off.   Elray McgregorCynthia Wynn 04/21/2018, 12:57 PM  Sheran Lawlessyndi Wynn, PT Acute Rehabilitation Services [phone] 04/21/2018\

## 2018-04-21 NOTE — Progress Notes (Signed)
  Echocardiogram 2D Echocardiogram has been performed.  Raneisha Bress G Tiphany Fayson 04/21/2018, 3:17 PM

## 2018-04-21 NOTE — Plan of Care (Signed)

## 2018-04-21 NOTE — Progress Notes (Signed)
ANTICOAGULATION CONSULT NOTE - Follow Up Consult  Pharmacy Consult for Heparin > Coumadin Indication: atrial fibrillation  Not on File  Patient Measurements: Height: 5\' 7"  (170.2 cm) Weight: 166 lb 3.2 oz (75.4 kg)(scale a) IBW/kg (Calculated) : 66.1 Heparin Dosing Weight: 74 kg  Vital Signs: Temp: 97 F (36.1 C) (12/20 0524) BP: 106/70 (12/20 0524) Pulse Rate: 91 (12/20 0524)  Labs: Recent Labs    04/19/18 0349 04/19/18 1348 04/20/18 0345 04/20/18 2023 04/21/18 0540  HGB 11.6*  --  12.0*  --  12.1*  HCT 36.0*  --  36.5*  --  38.6*  PLT 126*  --  143*  --  134*  LABPROT 21.8*  --  18.0*  --  18.8*  INR 1.93  --  1.50  --  1.59  HEPARINUNFRC  --  0.23*  --  0.62 0.73*  CREATININE 1.73*  --  1.38*  --  1.35*    Estimated Creatinine Clearance: 40.8 mL/min (A) (by C-G formula based on SCr of 1.35 mg/dL (H)).  . sodium chloride 250 mL (04/19/18 0618)  . heparin 1,200 Units/hr (04/21/18 [id number])     Assessment: 82 yr old male presented on 12/16 with worsening LE edema and abnormal echo.  On warfarin prior to admission for atrial fibrillation.  Warfarin held in case cardiac cath needed, stress test done today noted negative for ischemia.  Patient on heparin bridge to warfarin. Heparin level is just above goal this morning at 0.73. INR is subtherapeutic at 1.59 but trending up. No bleeding noted, CBC is stable.    Home Coumadin regimen:  5 mg daily.  Last dose 12/15.  Goal of Therapy:  INR 2-3 Heparin level 0.3-0.7 units/ml Monitor platelets by anticoagulation protocol: Yes   Plan:  Decrease heparin drip to 1200 units/hr Warfarin 5 mg PO tonight Daily heparin level, INR and CBC Monitor for s/sx of bleeding   Loura BackJennifer Cardiff, PharmD, BCPS Clinical Pharmacist Clinical phone for 04/21/2018 until 3p is [phone] 04/21/2018 7:30 AM  **Pharmacist phone directory can now be found on [email or web address] listed under [facility]**

## 2018-04-21 NOTE — Progress Notes (Signed)
Progress Note  Patient Name: Ross Sweeney Date of Encounter: 04/21/2018  Primary Cardiologist: No primary care provider on file. Will follow up in [place]  Subjective   Feeling well.  Increased energy.  Inpatient Medications    Scheduled Meds: . aspirin EC  81 mg Oral Daily  . digoxin  0.125 mg Oral Daily  . furosemide  40 mg Intravenous BID  . metoprolol succinate  200 mg Oral Daily  . potassium chloride  40 mEq Oral BID WC  . rosuvastatin  20 mg Oral q1800  . sodium chloride flush  3 mL Intravenous Q12H  . Warfarin - Pharmacist Dosing Inpatient   Does not apply q1800   Continuous Infusions: . sodium chloride 250 mL (04/19/18 0618)  . heparin 1,200 Units/hr (04/21/18 0714)   PRN Meds: sodium chloride, acetaminophen, ondansetron (ZOFRAN) IV, sodium chloride flush   Vital Signs    Vitals:   04/20/18 1000 04/20/18 1913 04/21/18 0524 04/21/18 0844  BP: 100/76 (!) 91/50 106/70 107/73  Pulse: 100 90 91 80  Resp:  18 18 18   Temp:  98.2 F (36.8 C) (!) 97 F (36.1 C)   TempSrc:  Oral    SpO2:  97% 95%   Weight:   75.4 kg   Height:        Intake/Output Summary (Last 24 hours) at 04/21/2018 0936 Last data filed at 04/21/2018 0900 Gross per 24 hour  Intake 1341.52 ml  Output 4000 ml  Net -2658.48 ml   Filed Weights   04/19/18 0355 04/20/18 0242 04/21/18 0524  Weight: 80.8 kg 77.9 kg 75.4 kg    Telemetry    Atrial fibrillation.  Rate 80s-100s. - Personally Reviewed  ECG    n/a - Personally Reviewed  Physical Exam   VS:  BP 107/73 (BP Location: Left Arm)   Pulse 80   Temp (!) 97 F (36.1 C)   Resp 18   Ht 5\' 7"  (1.702 m)   Wt 75.4 kg Comment: scale a  SpO2 95%   BMI 26.03 kg/m  , BMI Body mass index is 26.03 kg/m. GENERAL:  Well appearing HEENT: Pupils equal round and reactive, fundi not visualized, oral mucosa unremarkable NECK:  No jugular venous distention, waveform within normal limits, carotid upstroke brisk and symmetric, no bruits,  no thyromegaly LYMPHATICS:  No cervical adenopathy HEART:  Irregularly irregular.  PMI not displaced or sustained,S1 and S2 within normal limits, no S3, no S4, no clicks, no rubs, III/VI systolic murmurs ABD:  Flat, positive bowel sounds normal in frequency in pitch, no bruits, no rebound, no guarding, no midline pulsatile mass, no hepatomegaly, no splenomegaly EXT:  2 plus pulses throughout, 2+ LE edema to lower tibia bilaterally, no cyanosis no clubbing SKIN:  No rashes no nodules NEURO:  Cranial nerves II through XII grossly intact, motor grossly intact throughout [facility]:  Cognitively intact, oriented to person place and time   Labs    Chemistry Recent Labs  Lab 04/19/18 0349 04/20/18 0345 04/21/18 0540  NA 138 139 138  K 3.9 4.0 3.3*  CL 102 96* 95*  CO2 27 32 32  GLUCOSE 122* 90 90  BUN 38* 27* 20  CREATININE 1.73* 1.38* 1.35*  CALCIUM 8.1* 7.6* 7.6*  GFRNONAA 36* 48* 49*  GFRAA 42* 56* 57*  ANIONGAP 9 11 11      Hematology Recent Labs  Lab 04/19/18 0349 04/20/18 0345 04/21/18 0540  WBC 6.5 7.1 6.5  RBC 3.86* 3.91* 4.15*  HGB 11.6*  12.0* 12.1*  HCT 36.0* 36.5* 38.6*  MCV 93.3 93.4 93.0  MCH 30.1 30.7 29.2  MCHC 32.2 32.9 31.3  RDW 15.0 15.0 15.0  PLT 126* 143* 134*    Cardiac EnzymesNo results for input(s): TROPONINI in the last 168 hours.  Recent Labs  Lab April 09, 2018 1256  TROPIPOC 0.02     BNP Recent Labs  Lab April 09, 2018 1624  BNP 1,380.5*     DDimer No results for input(s): DDIMER in the last 168 hours.   Radiology    Nm Myocar Multi W/spect W/wall Motion / Ef  Result Date: 04/19/2018 CLINICAL DATA:  CAD evaluation EXAM: MYOCARDIAL IMAGING WITH SPECT (REST AND PHARMACOLOGIC-STRESS) GATED LEFT VENTRICULAR WALL MOTION STUDY LEFT VENTRICULAR EJECTION FRACTION TECHNIQUE: Standard myocardial SPECT imaging was performed after resting intravenous injection of 10 mCi Tc-2871m tetrofosmin. Subsequently, intravenous infusion of Lexiscan was performed under  the supervision of the Cardiology staff. At peak effect of the drug, 30 mCi Tc-6071m tetrofosmin was injected intravenously and standard myocardial SPECT imaging was performed. Quantitative gated imaging was also performed to evaluate left ventricular wall motion, and estimate left ventricular ejection fraction. COMPARISON:  None. FINDINGS: Perfusion: No decreased activity in the left ventricle on stress imaging to suggest reversible ischemia or infarction. Wall Motion: Generalized hypokinesia Left Ventricular Ejection Fraction: 37 % End diastolic volume 152 ml End systolic volume 95 ml IMPRESSION: 1. No reversible ischemia or infarction. 2. Generalized hypokinesia. 3. Left ventricular ejection fraction 37% 4. Non invasive risk stratification*: Intermediate *2012 Appropriate Use Criteria for Coronary Revascularization Focused Update: J Am Coll Cardiol. 2012;59(9):857-881. http://content.dementiazones.comonlinejacc.org/article.aspx?articleid=1201161 Electronically Signed   By: Charlett NoseKevin  Dover M.D.   On: 04/19/2018 13:09    Cardiac Studies   Echo 04/2018: LVEF 21%.  Global hypokinesis.  3+MR/TR  Patient Profile     [name] is an [age]M with chronic atrial fibrillation, diabetes and hyperlipidemia here with newly diagnosed acute systolic heart failure and atrial fibrillation with RVR.    Assessment & Plan    # Atrial fibrillation with RVR: Rates have been in the 120s at home for a long time but are now mostly <100 bpm.  Metoprolol tartrate was switched to succinate.  Digoxin started this admission. Dose was increased 12/19.  Recheck level on 12/22.  Warfarin was restarted but INR remains subtherapeutic.    # Acute systolic and diastolic heart failure: LVEF 21%.  Likely tachycardia related.  Rate control as above.  Lexiscan Myoview was negative for ischemia.  Continue diuresis with lasix 40mg  IV bid.  Hopefully we can switch to oral tomorrow.   If his blood pressure allows we will add Entresto or an ARB.  Renal function is  significantly improved with diuresis.  # Mitral regurgitation: # Tricuspid regurgitation: 3+ regurgitation of both valves noted on his echo prior to admission.  This is likely functional.  We were unable to view his images of his OSH echo and only have a written report.  He is now much more euvolemic so we will repeat his echo.  Likely no plans for intervention on his valve at this time until we see if his systolic function improves with rate control.   For questions or updates, please contact [facility] Please consult [email or web address] for contact info under        Signed, Chilton Siiffany [place], MD  04/21/2018, 9:36 AM

## 2018-04-22 LAB — CBC
HCT: 37.8 % — ABNORMAL LOW (ref 39.0–52.0)
Hemoglobin: 12.1 g/dL — ABNORMAL LOW (ref 13.0–17.0)
MCH: 30.5 pg (ref 26.0–34.0)
MCHC: 32 g/dL (ref 30.0–36.0)
MCV: 95.2 fL (ref 80.0–100.0)
Platelets: 143 10*3/uL — ABNORMAL LOW (ref 150–400)
RBC: 3.97 MIL/uL — ABNORMAL LOW (ref 4.22–5.81)
RDW: 15.1 % (ref 11.5–15.5)
WBC: 7.1 10*3/uL (ref 4.0–10.5)
nRBC: 0 % (ref 0.0–0.2)

## 2018-04-22 LAB — BASIC METABOLIC PANEL
Anion gap: 10 (ref 5–15)
BUN: 18 mg/dL (ref 8–23)
CO2: 31 mmol/L (ref 22–32)
Calcium: 7.6 mg/dL — ABNORMAL LOW (ref 8.9–10.3)
Chloride: 97 mmol/L — ABNORMAL LOW (ref 98–111)
Creatinine, Ser: 1.26 mg/dL — ABNORMAL HIGH (ref 0.61–1.24)
GFR calc Af Amer: >60 mL/min (ref >60)
GFR calc non Af Amer: 54 mL/min — ABNORMAL LOW (ref >60)
Glucose, Bld: 90 mg/dL (ref 70–99)
POTASSIUM: 4.3 mmol/L (ref 3.5–5.1)
SODIUM: 138 mmol/L (ref 135–145)

## 2018-04-22 LAB — CULTURE, BLOOD (ROUTINE X 2)
Culture: NO GROWTH
Culture: NO GROWTH

## 2018-04-22 LAB — HEPARIN LEVEL (UNFRACTIONATED): Heparin Unfractionated: 0.67 IU/mL (ref 0.30–0.70)

## 2018-04-22 LAB — PROTIME-INR
INR: 1.65
Prothrombin Time: 19.3 seconds — ABNORMAL HIGH (ref 11.4–15.2)

## 2018-04-22 LAB — GLUCOSE, CAPILLARY: Glucose-Capillary: 159 mg/dL — ABNORMAL HIGH (ref 70–99)

## 2018-04-22 MED ORDER — POTASSIUM CHLORIDE CRYS ER 20 MEQ PO TBCR
40.0000 meq | EXTENDED_RELEASE_TABLET | Freq: Every day | ORAL | Status: DC
  Administered 2018-04-22 – 2018-04-23 (×2): 40 meq via ORAL
  Filled 2018-04-22 (×2): qty 2

## 2018-04-22 MED ORDER — WARFARIN SODIUM 7.5 MG PO TABS
7.5000 mg | ORAL_TABLET | Freq: Once | ORAL | Status: AC
  Administered 2018-04-22: 7.5 mg via ORAL
  Filled 2018-04-22: qty 1

## 2018-04-22 NOTE — Progress Notes (Signed)
PROGRESS NOTE  Ross Sweeney:[id number] DOB: Feb 06, 1936 DOA: May 06, 2018 PCP: Oval Linsey, MD  HPI/Recap of past 24 hours: Ross Sweeney is a 82 y.o. male with medical history significant of DM; afib; and CHF presenting after being sent by PCP. Dr. Janna Arch did an echo this AM and said since the last one - 8-9 years ago - something caused the heart muscle to weaken and so he sent him in.  He has afib and it "runs wide open all the time."  The patinet isn't sure why he did the echo, but he does have DOE and LE edema.  He can not walk through his house without feeling SOB.  Symptoms have been going on "this year."  Occasional nonproductive cough.  +wheezing, occasional, most days.  +LE edema x 3-4 weeks.  No weight gain since the edema started but his weight is up prior.  He had been started "on a pill to take it off but I told him this morning it wasn't taking it off."  His weight this AM was 196, usual weight 170-185.  No orthopnea.  No PND.  He wakes up thirsty.  He only feels SOB with ambulation.  04/19/2018: Stress test done.  No new findings. 04/20/2018:  Subtherapeutic INR 1.50.  Coumadin restarted. 04/21/18: Seen and examined with his daughter at his bedside. No new complaints. No chest pain or palpitations. Meds being adjusted by cardiology.  04/22/18: Seen and examined at his bedside. No acute events overnight. Needs more diuresis per cardiology. Will continue IV lasix. No new complaints.  Assessment/Plan: Principal Problem:   Acute on chronic systolic CHF (congestive heart failure) (HCC) Active Problems:   Diabetes mellitus without complication (HCC)   A-fib (HCC)   CKD (chronic kidney disease), stage III (HCC)  Acute on chronic combined diastolic and systolic CHF Prior history of LVEF 45% Had a reported echo with LVEF of 21% Repeated 2D echo done on 04/21/18 revealed LVEF 35-40% w diffused hypokinesis, improved after diuresis Continue Lasix 40 mg twice daily Continue  strict I's and O's Continue to monitor volume status -2550cc last 24 h Continue to diurese as recommended by cardiology Negative stress test 04/19/2018  Chronic A. fib with uncontrolled rate Cardiology following Rate is now controlled on Toprol-XL 200 mg daily Continue digoxin 0.125 mg oral daily Negative stress test Digoxin level subtherapeutic at 0.3  Subtherapeutic INR, improving INR 1.65 from 1.50 C/w Coumadin Pharmacy managing Coumadin INR goal between 2 and 3  Mitral regurgitation/tricuspid regurgitation Cardiology suspects this is likely functional  Hyperlipidemia Continue Crestor  Type 2 diabetes A1c 6.1 Appears well controlled on metformin at home Sensitive insulin sliding scale Avoid hypoglycemia  AKI on CKD 3 Baseline creatinine appears to be 1.38 continue with GFR of 48 Presented with creatinine of 1.87 and GFR of 33 Creatinine today 1.26 from 1.38 improving while on diuretics Continue to avoid nephrotoxic agents Repeat BMP in the morning  Hypomagnesemia Magnesium 1.7 Repleted with 2 g IV magnesium once Repeat level in the am   DVT prophylaxis:  Coumadin Code Status:  DNR - Family Communication:  None at bedside Disposition Plan:   Home when cardiology signs off. Possibly tomorrow 04/23/18. Consults called: Cardiology;     Objective: Vitals:   04/22/18 0705 04/22/18 0947 04/22/18 1110 04/22/18 1200  BP:  103/66 109/66 98/75  Pulse:  94 98 75  Resp:    18  Temp:    97.8 F (36.6 C)  TempSrc:    Oral  SpO2:    98%  Weight: 73.6 kg     Height:        Intake/Output Summary (Last 24 hours) at 04/22/2018 1339 Last data filed at 04/22/2018 1007 Gross per 24 hour  Intake 460 ml  Output 2400 ml  Net -1940 ml   Filed Weights   04/20/18 0242 04/21/18 0524 04/22/18 0705  Weight: 77.9 kg 75.4 kg 73.6 kg    Exam:  . General: 82 y.o. year-old male WD WN NAD A&O x 3 . Cardiovascular: IRRR no rubs or gallops No JVD or  thyromegaly . Respiratory: CTA no wheezes or rales. Good inspiratory efforts. . Abdomen: Soft nontender nondistended with normal bowel sounds x4 quadrants. . Musculoskeletal: No lower extremity edema. 2/4 pulses in all 4 extremities. . Skin: No ulcerative lesions noted or rashes . Psychiatry: Mood is appropriate for condition and setting   Data Reviewed: CBC: Recent Labs  Lab 08/26/17 2327 04/19/18 0349 04/20/18 0345 04/21/18 0540 04/22/18 0413  WBC 6.9 6.5 7.1 6.5 7.1  NEUTROABS 4.3  --   --   --   --   HGB 11.6* 11.6* 12.0* 12.1* 12.1*  HCT 38.2* 36.0* 36.5* 38.6* 37.8*  MCV 96.2 93.3 93.4 93.0 95.2  PLT 132* 126* 143* 134* 143*   Basic Metabolic Panel: Recent Labs  Lab 04/18/18 0423 04/19/18 0349 04/20/18 0345 04/21/18 0540 04/22/18 0719  NA 140 138 139 138 138  K 4.9 3.9 4.0 3.3* 4.3  CL 107 102 96* 95* 97*  CO2 23 27 32 32 31  GLUCOSE 103* 122* 90 90 90  BUN 47* 38* 27* 20 18  CREATININE 1.92* 1.73* 1.38* 1.35* 1.26*  CALCIUM 8.5* 8.1* 7.6* 7.6* 7.6*  MG  --  1.5* 1.7  --   --    GFR: Estimated Creatinine Clearance: 43.7 mL/min (A) (by C-G formula based on SCr of 1.26 mg/dL (H)). Liver Function Tests: No results for input(s): AST, ALT, ALKPHOS, BILITOT, PROT, ALBUMIN in the last 168 hours. No results for input(s): LIPASE, AMYLASE in the last 168 hours. No results for input(s): AMMONIA in the last 168 hours. Coagulation Profile: Recent Labs  Lab 04/18/18 0423 04/19/18 0349 04/20/18 0345 04/21/18 0540 04/22/18 0413  INR 2.35 1.93 1.50 1.59 1.65   Cardiac Enzymes: No results for input(s): CKTOTAL, CKMB, CKMBINDEX, TROPONINI in the last 168 hours. BNP (last 3 results) No results for input(s): PROBNP in the last 8760 hours. HbA1C: No results for input(s): HGBA1C in the last 72 hours. CBG: Recent Labs  Lab 04/18/18 1646 04/18/18 2057 04/19/18 0746 04/19/18 1135 04/19/18 1629  GLUCAP 123* 139* 107* 159* 167*   Lipid Profile: No results for  input(s): CHOL, HDL, LDLCALC, TRIG, CHOLHDL, LDLDIRECT in the last 72 hours. Thyroid Function Tests: No results for input(s): TSH, T4TOTAL, FREET4, T3FREE, THYROIDAB in the last 72 hours. Anemia Panel: No results for input(s): VITAMINB12, FOLATE, FERRITIN, TIBC, IRON, RETICCTPCT in the last 72 hours. Urine analysis: No results found for: COLORURINE, APPEARANCEUR, LABSPEC, PHURINE, GLUCOSEU, HGBUR, BILIRUBINUR, KETONESUR, PROTEINUR, UROBILINOGEN, NITRITE, LEUKOCYTESUR Sepsis Labs: @LABRCNTIP (procalcitonin:4,lacticidven:4)  ) Recent Results (from the past 240 hour(s))  Blood Culture (routine x 2)     Status: None   Collection Time: 08/26/17  5:50 PM  Result Value Ref Range Status   Specimen Description BLOOD LEFT HAND  Final   Special Requests   Final    BOTTLES DRAWN AEROBIC AND ANAEROBIC Blood Culture results may not be optimal due to an excessive volume of blood  received in culture bottles   Culture   Final    NO GROWTH 5 DAYS Performed at [facility], [address] [place], [place] [address]    Report Status 04/22/2018 FINAL  Final  Blood Culture (routine x 2)     Status: None   Collection Time: 05/02/2018  5:50 PM  Result Value Ref Range Status   Specimen Description BLOOD RIGHT HAND  Final   Special Requests   Final    BOTTLES DRAWN AEROBIC AND ANAEROBIC Blood Culture results may not be optimal due to an excessive volume of blood received in culture bottles   Culture   Final    NO GROWTH 5 DAYS Performed at [facility], [address] [place], [place] [address]    Report Status 04/22/2018 FINAL  Final  Urine culture     Status: Abnormal   Collection Time: 04/23/2018  9:42 PM  Result Value Ref Range Status   Specimen Description URINE, CATHETERIZED  Final   Special Requests   Final    NONE Performed at [facility], [address] [place], [place] [address]    Culture MULTIPLE SPECIES PRESENT, SUGGEST RECOLLECTION (A)  Final   Report Status 04/19/2018  FINAL  Final      Studies: No results found.  Scheduled Meds: . aspirin EC  81 mg Oral Daily  . digoxin  0.125 mg Oral Daily  . furosemide  40 mg Intravenous BID  . metoprolol succinate  200 mg Oral Daily  . potassium chloride  40 mEq Oral Daily  . rosuvastatin  20 mg Oral q1800  . sodium chloride flush  3 mL Intravenous Q12H  . warfarin  7.5 mg Oral ONCE-1800  . Warfarin - Pharmacist Dosing Inpatient   Does not apply q1800    Continuous Infusions: . sodium chloride 250 mL (04/19/18 0618)  . heparin 1,200 Units/hr (04/22/18 [id number])     LOS: 5 days     Darlin Droparole N Lu Paradise, MD [facility] Pager [phone]  If 7PM-7AM, please contact night-coverage [email or web address] Password [email or web address] 04/22/2018, 1:39 PM

## 2018-04-22 NOTE — Progress Notes (Signed)
Progress Note  Patient Name: Ross Sweeney Date of Encounter: 04/22/2018  Primary Cardiologist: New, to establish [place]  Subjective   No significant SOB, did well with walking yesterday per his report  Inpatient Medications    Scheduled Meds: . aspirin EC  81 mg Oral Daily  . digoxin  0.125 mg Oral Daily  . furosemide  40 mg Intravenous BID  . metoprolol succinate  200 mg Oral Daily  . potassium chloride  40 mEq Oral Daily  . rosuvastatin  20 mg Oral q1800  . sodium chloride flush  3 mL Intravenous Q12H  . warfarin  7.5 mg Oral ONCE-1800  . Warfarin - Pharmacist Dosing Inpatient   Does not apply q1800   Continuous Infusions: . sodium chloride 250 mL (04/19/18 0618)  . heparin 1,200 Units/hr (04/22/18 16100627)   PRN Meds: sodium chloride, acetaminophen, ondansetron (ZOFRAN) IV, sodium chloride flush   Vital Signs    Vitals:   04/21/18 1900 04/22/18 0043 04/22/18 0437 04/22/18 0705  BP:  109/73 101/64   Pulse:  99 94   Resp: 16 20 16    Temp:  98.4 F (36.9 C) 98.3 F (36.8 C)   TempSrc:  Oral Oral   SpO2:  97% 98%   Weight:    73.6 kg  Height:        Intake/Output Summary (Last 24 hours) at 04/22/2018 0924 Last data filed at 04/22/2018 0909 Gross per 24 hour  Intake 220 ml  Output 2400 ml  Net -2180 ml   Filed Weights   04/20/18 0242 04/21/18 0524 04/22/18 0705  Weight: 77.9 kg 75.4 kg 73.6 kg    Telemetry    afib 80 to 110 - Personally Reviewed  ECG    na  Physical Exam   GEN: No acute distress.   Neck: elevated JVD Cardiac: irreg Respiratory: Clear to auscultation bilaterally. GI: Soft, nontender, non-distended  MS: trace bilateral edema Neuro:  Nonfocal  Psych: Normal affect   Labs    Chemistry Recent Labs  Lab 04/20/18 0345 04/21/18 0540 04/22/18 0719  NA 139 138 138  K 4.0 3.3* 4.3  CL 96* 95* 97*  CO2 32 32 31  GLUCOSE 90 90 90  BUN 27* 20 18  CREATININE 1.38* 1.35* 1.26*  CALCIUM 7.6* 7.6* 7.6*  GFRNONAA 48* 49*  54*  GFRAA 56* 57* >60  ANIONGAP 11 11 10      Hematology Recent Labs  Lab 04/20/18 0345 04/21/18 0540 04/22/18 0413  WBC 7.1 6.5 7.1  RBC 3.91* 4.15* 3.97*  HGB 12.0* 12.1* 12.1*  HCT 36.5* 38.6* 37.8*  MCV 93.4 93.0 95.2  MCH 30.7 29.2 30.5  MCHC 32.9 31.3 32.0  RDW 15.0 15.0 15.1  PLT 143* 134* 143*    Cardiac EnzymesNo results for input(s): TROPONINI in the last 168 hours.  Recent Labs  Lab 03-11-18 1256  TROPIPOC 0.02     BNP Recent Labs  Lab 03-11-18 1624  BNP 1,380.5*     DDimer No results for input(s): DDIMER in the last 168 hours.   Radiology    No results found.  Cardiac Studies   Patient Profile     [name] is an [age]M with chronic atrial fibrillation, diabetes and hyperlipidemia here with newly diagnosed acute systolic heart failure and atrial fibrillation with RVR.   Assessment & Plan    1. Chronic afib - long history dating back to 2006 according to notes - elevated rates this admission - rate control with toprol  and digoxin - on coumadin for stroke prevention. Subtherapeutic INR, on hep gtt    2. Acute combined systolic/diastolic HF - 04/2018 echo LVEF 35-40%, diffuse hypokinesis,  Mod MR, PASP 49 - suspected tachycardia mediated due to poorly controlled afib - 04/2018 nuclear stress without evidence of significant ishcemic heart disease - negative 2 L yesterday, negative 16 L since admission. He is on lasix 40mg  IV bid. Downtrending Cr with diuresis consistent with venous congetsion and CHF - medical therapy with Toprol XL 200. Soft bp's, don't think would tolerate entresto. May consider very low dose ACE or ARB pending bp trend.   - not quite euvolemic, would continue IV diuresis today. LIkely change to oral tomorrow, probable discharge tomorrow.   3. Valvular heart disease - moderate MR, mild TR. Appears more significant valve dysfunction was reported on his echo from outside hospital - follow with management of his  cardiomyopathy    For questions or updates, please contact [facility] Please consult [email or web address] for contact info under        Signed, Ross Sweeney, [place], MD  04/22/2018, 9:24 AM

## 2018-04-22 NOTE — Progress Notes (Signed)
PROGRESS NOTE  Ross HearingBobby L Barretta UJW:[id number] DOB: 06/09/1935 DOA: 08/03/17 PCP: Oval Linseyondiego, [name], MD  HPI/Recap of past 24 hours: Ross Sweeney is a 82 y.o. male with medical history significant of DM; afib; and CHF presenting after being sent by PCP. Dr. Janna Archondiego did an echo this AM and said since the last one - 8-9 years ago - something caused the heart muscle to weaken and so he sent him in.  He has afib and it "runs wide open all the time."  The patinet isn't sure why he did the echo, but he does have DOE and LE edema.  He can not walk through his house without feeling SOB.  Symptoms have been going on "this year."  Occasional nonproductive cough.  +wheezing, occasional, most days.  +LE edema x 3-4 weeks.  No weight gain since the edema started but his weight is up prior.  He had been started "on a pill to take it off but I told him this morning it wasn't taking it off."  His weight this AM was 196, usual weight 170-185.  No orthopnea.  No PND.  He wakes up thirsty.  He only feels SOB with ambulation.  04/19/2018: Stress test done.  No new findings. 04/20/2018:  Subtherapeutic INR 1.50.  Coumadin restarted. 04/21/18: Seen and examined with his daughter at his bedside. No new complaints. No chest pain or palpitations. Meds being adjusted by cardiology.  Assessment/Plan: Principal Problem:   Acute on chronic systolic CHF (congestive heart failure) (HCC) Active Problems:   Diabetes mellitus without complication (HCC)   A-fib (HCC)   CKD (chronic kidney disease), stage III (HCC)  Acute on chronic combined diastolic and systolic CHF Prior history of LVEF 45% Had a reported echo with LVEF of 21% Chest x-ray done on 08/03/17 independently reviewed revealed cardiomegaly with mild increase in pulmonary vascularity. Continue Lasix 40 mg twice daily Continue strict I's and O's Continue to monitor volum Continue to diurese as recommended by cardiology Negative stress test  04/19/2018  Chronic A. fib with uncontrolled rate Cardiology following Rate is now controlled on Toprol-XL 200 mg daily Continue digoxin 0.125 mg oral daily Negative stress test Digoxin level subtherapeutic at 0.3  Subtherapeutic INR INR 1.50 Coumadin restarted by cardiology Pharmacy managing Coumadin INR goal between 2 and 3  Mitral regurgitation/tricuspid regurgitation Cardiology suspects this is likely functional  Hyperlipidemia Continue Crestor  Type 2 diabetes A1c 6.1 Appears well controlled on metformin at home Sensitive insulin sliding scale Avoid hypoglycemia  AKI on CKD 3 Baseline creatinine appears to be 1.38 continue with GFR of 48 Presented with creatinine of 1.87 and GFR of 33 Creatinine today 1.38 Continue to avoid nephrotoxic agents Repeat BMP in the morning  Hypomagnesemia Magnesium 1.7 Repleted with 2 g IV magnesium once  Subtherapeutic INR INR 1.93 Coumadin held due to stress test Pharmacy managing INR Repeat INR in the morning Defer to cardiology to resume Coumadin   DVT prophylaxis:  Coumadin Code Status:  DNR - Family Communication:  None at bedside Disposition Plan:   Home when cardiology signs off Consults called: Cardiology;     Objective: Vitals:   04/22/18 0705 04/22/18 0947 04/22/18 1110 04/22/18 1200  BP:  103/66 109/66 98/75  Pulse:  94 98 75  Resp:    18  Temp:    97.8 F (36.6 C)  TempSrc:    Oral  SpO2:    98%  Weight: 73.6 kg     Height:  Intake/Output Summary (Last 24 hours) at 04/22/2018 1333 Last data filed at 04/22/2018 1007 Gross per 24 hour  Intake 460 ml  Output 2400 ml  Net -1940 ml   Filed Weights   04/20/18 0242 04/21/18 0524 04/22/18 0705  Weight: 77.9 kg 75.4 kg 73.6 kg    Exam:  . General: 82 y.o. year-old male well-developed well-nourished in no acute distress.  Alert and oriented x3.  Cardiovascular: Irregular rate and rhythm with no rubs or gallops.  No JVD or thyromegaly  noted. Marland Kitchen Respiratory: Rectal to station with no wheezes or rales.  Good inspiratory effort.   . Abdomen: Soft nontender nondistended with normal bowel sounds x4 quadrants. . Musculoskeletal: No lower extremity edema. 2/4 pulses in all 4 extremities. . Skin: No ulcerative lesions noted or rashes . Psychiatry: Mood is appropriate for condition and setting   Data Reviewed: CBC: Recent Labs  Lab 2018-05-09 2327 04/19/18 0349 04/20/18 0345 04/21/18 0540 04/22/18 0413  WBC 6.9 6.5 7.1 6.5 7.1  NEUTROABS 4.3  --   --   --   --   HGB 11.6* 11.6* 12.0* 12.1* 12.1*  HCT 38.2* 36.0* 36.5* 38.6* 37.8*  MCV 96.2 93.3 93.4 93.0 95.2  PLT 132* 126* 143* 134* 143*   Basic Metabolic Panel: Recent Labs  Lab 04/18/18 0423 04/19/18 0349 04/20/18 0345 04/21/18 0540 04/22/18 0719  NA 140 138 139 138 138  K 4.9 3.9 4.0 3.3* 4.3  CL 107 102 96* 95* 97*  CO2 23 27 32 32 31  GLUCOSE 103* 122* 90 90 90  BUN 47* 38* 27* 20 18  CREATININE 1.92* 1.73* 1.38* 1.35* 1.26*  CALCIUM 8.5* 8.1* 7.6* 7.6* 7.6*  MG  --  1.5* 1.7  --   --    GFR: Estimated Creatinine Clearance: 43.7 mL/min (A) (by C-G formula based on SCr of 1.26 mg/dL (H)). Liver Function Tests: No results for input(s): AST, ALT, ALKPHOS, BILITOT, PROT, ALBUMIN in the last 168 hours. No results for input(s): LIPASE, AMYLASE in the last 168 hours. No results for input(s): AMMONIA in the last 168 hours. Coagulation Profile: Recent Labs  Lab 04/18/18 0423 04/19/18 0349 04/20/18 0345 04/21/18 0540 04/22/18 0413  INR 2.35 1.93 1.50 1.59 1.65   Cardiac Enzymes: No results for input(s): CKTOTAL, CKMB, CKMBINDEX, TROPONINI in the last 168 hours. BNP (last 3 results) No results for input(s): PROBNP in the last 8760 hours. HbA1C: No results for input(s): HGBA1C in the last 72 hours. CBG: Recent Labs  Lab 04/18/18 1646 04/18/18 2057 04/19/18 0746 04/19/18 1135 04/19/18 1629  GLUCAP 123* 139* 107* 159* 167*   Lipid Profile: No  results for input(s): CHOL, HDL, LDLCALC, TRIG, CHOLHDL, LDLDIRECT in the last 72 hours. Thyroid Function Tests: No results for input(s): TSH, T4TOTAL, FREET4, T3FREE, THYROIDAB in the last 72 hours. Anemia Panel: No results for input(s): VITAMINB12, FOLATE, FERRITIN, TIBC, IRON, RETICCTPCT in the last 72 hours. Urine analysis: No results found for: COLORURINE, APPEARANCEUR, LABSPEC, PHURINE, GLUCOSEU, HGBUR, BILIRUBINUR, KETONESUR, PROTEINUR, UROBILINOGEN, NITRITE, LEUKOCYTESUR Sepsis Labs: @LABRCNTIP (procalcitonin:4,lacticidven:4)  ) Recent Results (from the past 240 hour(s))  Blood Culture (routine x 2)     Status: None   Collection Time: 05/09/18  5:50 PM  Result Value Ref Range Status   Specimen Description BLOOD LEFT HAND  Final   Special Requests   Final    BOTTLES DRAWN AEROBIC AND ANAEROBIC Blood Culture results may not be optimal due to an excessive volume of blood received in culture bottles  Culture   Final    NO GROWTH 5 DAYS Performed at [facility], [address] [place], [place] [address]    Report Status 04/22/2018 FINAL  Final  Blood Culture (routine x 2)     Status: None   Collection Time: 03/29/18  5:50 PM  Result Value Ref Range Status   Specimen Description BLOOD RIGHT HAND  Final   Special Requests   Final    BOTTLES DRAWN AEROBIC AND ANAEROBIC Blood Culture results may not be optimal due to an excessive volume of blood received in culture bottles   Culture   Final    NO GROWTH 5 DAYS Performed at [facility], [address] [place], [place] [address]    Report Status 04/22/2018 FINAL  Final  Urine culture     Status: Abnormal   Collection Time: 03/29/18  9:42 PM  Result Value Ref Range Status   Specimen Description URINE, CATHETERIZED  Final   Special Requests   Final    NONE Performed at [facility], [address] [place], [place] [address]    Culture MULTIPLE SPECIES PRESENT, SUGGEST RECOLLECTION (A)  Final   Report  Status 04/19/2018 FINAL  Final      Studies: No results found.  Scheduled Meds: . aspirin EC  81 mg Oral Daily  . digoxin  0.125 mg Oral Daily  . furosemide  40 mg Intravenous BID  . metoprolol succinate  200 mg Oral Daily  . potassium chloride  40 mEq Oral Daily  . rosuvastatin  20 mg Oral q1800  . sodium chloride flush  3 mL Intravenous Q12H  . warfarin  7.5 mg Oral ONCE-1800  . Warfarin - Pharmacist Dosing Inpatient   Does not apply q1800    Continuous Infusions: . sodium chloride 250 mL (04/19/18 0618)  . heparin 1,200 Units/hr (04/22/18 [id number])     LOS: 5 days     Darlin Droparole N Zoiee Wimmer, MD [facility] Pager [phone]  If 7PM-7AM, please contact night-coverage [email or web address] Password [email or web address] 04/22/2018, 1:33 PM

## 2018-04-22 NOTE — Progress Notes (Signed)
ANTICOAGULATION CONSULT NOTE - Follow Up Consult  Pharmacy Consult for Heparin > Coumadin Indication: atrial fibrillation  Not on File  Patient Measurements: Height: 5\' 7"  (170.2 cm) Weight: 162 lb 4.1 oz (73.6 kg) IBW/kg (Calculated) : 66.1 Heparin Dosing Weight: 74 kg  Vital Signs: Temp: 98.3 F (36.8 C) (12/21 0437) Temp Source: Oral (12/21 0437) BP: 101/64 (12/21 0437) Pulse Rate: 94 (12/21 0437)  Labs: Recent Labs    04/20/18 0345 04/20/18 2023 04/21/18 0540 04/22/18 0413  HGB 12.0*  --  12.1* 12.1*  HCT 36.5*  --  38.6* 37.8*  PLT 143*  --  134* 143*  LABPROT 18.0*  --  18.8* 19.3*  INR 1.50  --  1.59 1.65  HEPARINUNFRC  --  0.62 0.73* 0.67  CREATININE 1.38*  --  1.35*  --     Estimated Creatinine Clearance: 40.8 mL/min (A) (by C-G formula based on SCr of 1.35 mg/dL (H)).  . sodium chloride 250 mL (04/19/18 0618)  . heparin 1,200 Units/hr (04/22/18 62130627)     Assessment: 82 yr old male presented on 12/16 with worsening LE edema and abnormal echo.  On warfarin prior to admission for atrial fibrillation.  Warfarin held in case cardiac cath needed, stress test done today noted negative for ischemia.  Patient on heparin bridge to warfarin. Heparin level is at goal this morning at 0.67. INR is subtherapeutic continues to be subtherapeutic at 1.65 and slowly trending up. No bleeding noted, CBC is stable.    Home Coumadin regimen: 5 mg daily  Goal of Therapy:  INR 2-3 Heparin level 0.3-0.7 units/ml Monitor platelets by anticoagulation protocol: Yes   Plan:  Continue heparin drip at 1200 units/hr Increase Warfarin to 7.5 mg PO x1 tonight Daily heparin level, INR and CBC Monitor for s/sx of bleeding  Wendelyn Breslowylan Vonna Brabson, PharmD PGY1 Pharmacy Resident Phone: [phone] 04/22/2018 7:18 AM

## 2018-04-23 ENCOUNTER — Inpatient Hospital Stay (HOSPITAL_COMMUNITY): Payer: Medicare Other

## 2018-04-23 LAB — BASIC METABOLIC PANEL
ANION GAP: 15 (ref 5–15)
BUN: 22 mg/dL (ref 8–23)
CO2: 26 mmol/L (ref 22–32)
Calcium: 7.5 mg/dL — ABNORMAL LOW (ref 8.9–10.3)
Chloride: 93 mmol/L — ABNORMAL LOW (ref 98–111)
Creatinine, Ser: 1.42 mg/dL — ABNORMAL HIGH (ref 0.61–1.24)
GFR calc Af Amer: 54 mL/min — ABNORMAL LOW (ref >60)
GFR calc non Af Amer: 46 mL/min — ABNORMAL LOW (ref >60)
GLUCOSE: 142 mg/dL — AB (ref 70–99)
Potassium: 3.4 mmol/L — ABNORMAL LOW (ref 3.5–5.1)
Sodium: 134 mmol/L — ABNORMAL LOW (ref 135–145)

## 2018-04-23 LAB — GLUCOSE, CAPILLARY: Glucose-Capillary: 143 mg/dL — ABNORMAL HIGH (ref 70–99)

## 2018-04-23 LAB — CBC
HCT: 32.6 % — ABNORMAL LOW (ref 39.0–52.0)
Hemoglobin: 10.3 g/dL — ABNORMAL LOW (ref 13.0–17.0)
MCH: 30 pg (ref 26.0–34.0)
MCHC: 31.6 g/dL (ref 30.0–36.0)
MCV: 95 fL (ref 80.0–100.0)
Platelets: 168 10*3/uL (ref 150–400)
RBC: 3.43 MIL/uL — ABNORMAL LOW (ref 4.22–5.81)
RDW: 14.9 % (ref 11.5–15.5)
WBC: 8.7 10*3/uL (ref 4.0–10.5)
nRBC: 0 % (ref 0.0–0.2)

## 2018-04-23 LAB — HEPARIN LEVEL (UNFRACTIONATED)
Heparin Unfractionated: 0.78 IU/mL — ABNORMAL HIGH (ref 0.30–0.70)
Heparin Unfractionated: 0.73 IU/mL — ABNORMAL HIGH (ref 0.30–0.70)

## 2018-04-23 LAB — PROTIME-INR
INR: 1.91
PROTHROMBIN TIME: 21.6 s — AB (ref 11.4–15.2)

## 2018-04-23 LAB — MAGNESIUM: Magnesium: 1.5 mg/dL — ABNORMAL LOW (ref 1.7–2.4)

## 2018-04-23 MED ORDER — SENNOSIDES-DOCUSATE SODIUM 8.6-50 MG PO TABS
1.0000 | ORAL_TABLET | Freq: Two times a day (BID) | ORAL | Status: DC
  Administered 2018-04-23 (×2): 1 via ORAL
  Filled 2018-04-23 (×2): qty 1

## 2018-04-23 MED ORDER — POTASSIUM CHLORIDE CRYS ER 20 MEQ PO TBCR
40.0000 meq | EXTENDED_RELEASE_TABLET | Freq: Once | ORAL | Status: AC
  Administered 2018-04-23: 40 meq via ORAL
  Filled 2018-04-23: qty 2

## 2018-04-23 MED ORDER — ALBUMIN HUMAN 25 % IV SOLN
50.0000 g | Freq: Four times a day (QID) | INTRAVENOUS | Status: AC
  Administered 2018-04-23 – 2018-04-24 (×4): 50 g via INTRAVENOUS
  Filled 2018-04-23 (×6): qty 200

## 2018-04-23 MED ORDER — DILTIAZEM HCL 25 MG/5ML IV SOLN
10.0000 mg | Freq: Once | INTRAVENOUS | Status: AC
  Administered 2018-04-23: 10 mg via INTRAVENOUS
  Filled 2018-04-23: qty 5

## 2018-04-23 MED ORDER — MAGNESIUM SULFATE 4 GM/100ML IV SOLN
4.0000 g | Freq: Once | INTRAVENOUS | Status: AC
  Administered 2018-04-23: 4 g via INTRAVENOUS
  Filled 2018-04-23: qty 100

## 2018-04-23 MED ORDER — METOPROLOL SUCCINATE ER 25 MG PO TB24: 25.0000 mg | ORAL_TABLET | Freq: Every day | ORAL | Status: DC

## 2018-04-23 MED ORDER — SODIUM CHLORIDE 0.9 % IV SOLN
INTRAVENOUS | Status: DC | PRN
  Administered 2018-04-23: 250 mL via INTRAVENOUS

## 2018-04-23 MED ORDER — METOPROLOL SUCCINATE ER 25 MG PO TB24
25.0000 mg | ORAL_TABLET | Freq: Two times a day (BID) | ORAL | Status: DC
  Administered 2018-04-23 (×2): 25 mg via ORAL
  Filled 2018-04-23 (×2): qty 1

## 2018-04-23 MED ORDER — METOPROLOL TARTRATE 5 MG/5ML IV SOLN
5.0000 mg | Freq: Once | INTRAVENOUS | Status: AC
  Administered 2018-04-23: 5 mg via INTRAVENOUS
  Filled 2018-04-23: qty 5

## 2018-04-23 MED ORDER — WARFARIN SODIUM 5 MG PO TABS
5.0000 mg | ORAL_TABLET | Freq: Once | ORAL | Status: AC
  Administered 2018-04-23: 5 mg via ORAL
  Filled 2018-04-23: qty 1

## 2018-04-23 MED ORDER — HYDROCORTISONE 1 % EX CREA
TOPICAL_CREAM | Freq: Two times a day (BID) | CUTANEOUS | Status: DC
  Administered 2018-04-23 (×2): via TOPICAL
  Filled 2018-04-23: qty 28

## 2018-04-23 MED ORDER — SODIUM CHLORIDE 0.9 % IV BOLUS: 250.0000 mL | Freq: Once | INTRAVENOUS | Status: DC

## 2018-04-23 MED ORDER — OXYCODONE HCL 5 MG PO TABS
5.0000 mg | ORAL_TABLET | Freq: Four times a day (QID) | ORAL | Status: DC | PRN
  Administered 2018-04-23: 5 mg via ORAL
  Filled 2018-04-23: qty 1

## 2018-04-23 NOTE — Progress Notes (Addendum)
ANTICOAGULATION CONSULT NOTE - Follow Up Consult  Pharmacy Consult for Heparin > Coumadin Indication: atrial fibrillation  Not on File  Patient Measurements: Height: 5\' 7"  (170.2 cm) Weight: 160 lb 9.6 oz (72.8 kg) IBW/kg (Calculated) : 66.1 HEPARIN DW (KG): 84.5  Vital Signs: Temp: 98.2 F (36.8 C) (12/22 0423) Temp Source: Oral (12/22 0423) BP: 102/54 (12/22 0423) Pulse Rate: 102 (12/22 0423)  Labs: Recent Labs    04/21/18 0540 04/22/18 0413 04/22/18 0719 04/23/18 0647  HGB 12.1* 12.1*  --  10.3*  HCT 38.6* 37.8*  --  32.6*  PLT 134* 143*  --  168  LABPROT 18.8* 19.3*  --  21.6*  INR 1.59 1.65  --  1.91  HEPARINUNFRC 0.73* 0.67  --  0.78*  CREATININE 1.35*  --  1.26* 1.42*    Estimated Creatinine Clearance: 38.8 mL/min (A) (by C-G formula based on SCr of 1.42 mg/dL (H)).  . sodium chloride 250 mL (04/19/18 0618)  . heparin 1,200 Units/hr (04/23/18 0105)     Assessment: 82 yr old male presented on 12/16 with worsening LE edema and abnormal echo.  On warfarin prior to admission for atrial fibrillation.  Warfarin held in case cardiac cath needed, stress test done today noted negative for ischemia.  12/22: Patient on heparin bridge to warfarin. Heparin level is above goal this morning at 0.78. INR continues to be subtherapeutic at 1.91 but trending up. No bleeding noted, CBC wnl.    Home Coumadin regimen: 5 mg daily  Goal of Therapy:  INR 2-3 Heparin level 0.3-0.7 units/ml Monitor platelets by anticoagulation protocol: Yes   Plan:  Decrease heparin drip to 1100 units/hr Continue PTA Warfarin 5 mg PO x1 tonight Heparin level at 1500 Daily heparin level, INR and CBC Monitor for s/sx of bleeding  Wendelyn Breslowylan Ralphie Lovelady, PharmD PGY1 Pharmacy Resident Phone: [phone] 04/23/2018 8:26 AM

## 2018-04-23 NOTE — Progress Notes (Signed)
Patient had a witnessed fall while at the sink.  V.S obtained and MD notified.   Patient stated he felt weak, but before he could sit down his legs gave out.   And he fell on his bottom.   Did not hit his head  Assessed back/ bottom area for any injury- no evidence of injury will continue to monitor and educated patient about having someone in room to assist from now on.

## 2018-04-23 NOTE — Progress Notes (Signed)
Pt request pain medication for sore ankle.   Page MD

## 2018-04-23 NOTE — Progress Notes (Signed)
ANTICOAGULATION CONSULT NOTE - Follow Up Consult  Pharmacy Consult for Heparin > Coumadin Indication: atrial fibrillation  Not on File  Patient Measurements: Height: 5\' 7"  (170.2 cm) Weight: 160 lb 9.6 oz (72.8 kg) IBW/kg (Calculated) : 66.1 HEPARIN DW (KG): 84.5  Vital Signs: Temp: 98.2 F (36.8 C) (12/22 1219) Temp Source: Oral (12/22 1219) BP: 87/48 (12/22 1219) Pulse Rate: 127 (12/22 1219)  Labs: Recent Labs    04/21/18 0540 04/22/18 0413 04/22/18 0719 04/23/18 0647 04/23/18 1507  HGB 12.1* 12.1*  --  10.3*  --   HCT 38.6* 37.8*  --  32.6*  --   PLT 134* 143*  --  168  --   LABPROT 18.8* 19.3*  --  21.6*  --   INR 1.59 1.65  --  1.91  --   HEPARINUNFRC 0.73* 0.67  --  0.78* 0.73*  CREATININE 1.35*  --  1.26* 1.42*  --     Estimated Creatinine Clearance: 38.8 mL/min (A) (by C-G formula based on SCr of 1.42 mg/dL (H)).  . sodium chloride 250 mL (04/19/18 0618)  . sodium chloride 250 mL (04/23/18 1051)  . albumin human 50 g (04/23/18 1527)  . heparin 1,100 Units/hr (04/23/18 0844)     Assessment: 82 yr old male presented on 12/16 with worsening LE edema and abnormal echo.  On warfarin prior to admission for atrial fibrillation.  Warfarin held in case cardiac cath needed but stress test negative so restarted.  Patient on heparin bridge to warfarin. Heparin level remains slightly above goal at 0.73. INR continues to be subtherapeutic at 1.91 but trending up. No bleeding noted, CBC wnl.    Home Coumadin regimen: 5 mg daily  Goal of Therapy:  INR 2-3 Heparin level 0.3-0.7 units/ml Monitor platelets by anticoagulation protocol: Yes   Plan:  Decrease heparin drip to 1000 units/hr Continue PTA Warfarin 5 mg PO x1 tonight Hopefully INR therapeutic in a.m. and heparin can be d/c Daily heparin level, INR and CBC Monitor for s/sx of bleeding  Christoper Fabianaron Johnte Portnoy, PharmD, BCPS Clinical pharmacist  **Pharmacist phone directory can now be found on [email or web address] (PW [email or web address]).   Listed under [facility]. 04/23/2018 3:55 PM

## 2018-04-23 NOTE — Progress Notes (Signed)
10 mg of cardizem given for hr 110-125 per on call NP.

## 2018-04-23 NOTE — Progress Notes (Signed)
Progress Note  Patient Name: Ross Sweeney Date of Encounter: 04/23/2018  Primary Cardiologist: New, to establish in   Subjective   SOB resolved. EPisode of orthostatic dizzieness and fall this AM.   Inpatient Medications    Scheduled Meds: . aspirin EC  81 mg Oral Daily  . digoxin  0.125 mg Oral Daily  . furosemide  40 mg Intravenous BID  . metoprolol succinate  200 mg Oral Daily  . potassium chloride  40 mEq Oral Daily  . potassium chloride  40 mEq Oral Once  . rosuvastatin  20 mg Oral q1800  . sodium chloride flush  3 mL Intravenous Q12H  . warfarin  5 mg Oral ONCE-1800  . Warfarin - Pharmacist Dosing Inpatient   Does not apply q1800   Continuous Infusions: . sodium chloride 250 mL (04/19/18 0618)  . heparin 1,100 Units/hr (04/23/18 0844)  . magnesium sulfate 1 - 4 g bolus IVPB     PRN Meds: sodium chloride, acetaminophen, ondansetron (ZOFRAN) IV, sodium chloride flush   Vital Signs    Vitals:   04/23/18 0423 04/23/18 0629 04/23/18 0805 04/23/18 0828  BP: (!) 102/54  107/72 (!) 91/50  Pulse: (!) 102  60 96  Resp: 16  18 18   Temp: 98.2 F (36.8 C)  97.6 F (36.4 C) 97.9 F (36.6 C)  TempSrc: Oral  Oral Oral  SpO2: 96%  95% 97%  Weight:  72.8 kg    Height:        Intake/Output Summary (Last 24 hours) at 04/23/2018 1006 Last data filed at 04/23/2018 0934 Gross per 24 hour  Intake 1440 ml  Output 2250 ml  Net -810 ml   Filed Weights   04/21/18 0524 04/22/18 0705 04/23/18 0629  Weight: 75.4 kg 73.6 kg 72.8 kg    Telemetry    afib and elevated rates - Personally Reviewed  ECG    na  Physical Exam   GEN: No acute distress.   Neck: No JVD Cardiac: irreg, tachy, no murmurs, rubs, or gallops.  Respiratory: Clear to auscultation bilaterally. GI: Soft, nontender, non-distended  MS: No edema; No deformity. Neuro:  Nonfocal  Psych: Normal affect   Labs    Chemistry Recent Labs  Lab 04/21/18 0540 04/22/18 0719 04/23/18 0647    NA 138 138 134*  K 3.3* 4.3 3.4*  CL 95* 97* 93*  CO2 32 31 26  GLUCOSE 90 90 142*  BUN 20 18 22   CREATININE 1.35* 1.26* 1.42*  CALCIUM 7.6* 7.6* 7.5*  GFRNONAA 49* 54* 46*  GFRAA 57* >60 54*  ANIONGAP 11 10 15      Hematology Recent Labs  Lab 04/21/18 0540 04/22/18 0413 04/23/18 0647  WBC 6.5 7.1 8.7  RBC 4.15* 3.97* 3.43*  HGB 12.1* 12.1* 10.3*  HCT 38.6* 37.8* 32.6*  MCV 93.0 95.2 95.0  MCH 29.2 30.5 30.0  MCHC 31.3 32.0 31.6  RDW 15.0 15.1 14.9  PLT 134* 143* 168    Cardiac EnzymesNo results for input(s): TROPONINI in the last 168 hours.  Recent Labs  Lab 08-25-17 1256  TROPIPOC 0.02     BNP Recent Labs  Lab 08-25-17 1624  BNP 1,380.5*     DDimer No results for input(s): DDIMER in the last 168 hours.   Radiology    No results found.  Cardiac Studies     Patient Profile     [name] is an 89M with chronic atrial fibrillation, diabetes and hyperlipidemia here with newly diagnosed acute  systolic heart failure and atrial fibrillation with RVR.   Assessment & Plan    1. Chronic afib - long history dating back to 2006 according to notes - elevated rates this admission - rate control with toprol and digoxin. Toprol held yesterday due to soft bp's. Did receive IV lopressor once yesterday and IV dilt. Would avoid any additional IV dilt due to systolic dysfunction - on coumadin for stroke prevention. Subtherapeutic INR, on hep gtt. INR 1.9, likely can come off heparin tomorrow.   - soft bp's likely due to overdiuresis. Try toprol 25mg  bid, conitnue digoxin. Hopefully bp's improve as fluid status trends back up and can titrate Toprol further    2. Acute combined systolic/diastolic HF - 04/2018 echo LVEF 35-40%, diffuse hypokinesis,  Mod MR, PASP 49 - suspected tachycardia mediated due to poorly controlled afib - 04/2018 nuclear stress without evidence of significant ishcemic heart disease - negative 1.5 L yesterday, negative 17 L since  admission. He is on lasix 40mg  IV bid. Downtrending Cr with diuresis consistent with venous congetsion and CHF - medical therapy with Toprol XL 200. Soft bp's, don't think would tolerate entresto. May consider very low dose ACE or ARB pending bp trend.   - appears euvolemic, and probably on the dry side due to his orthostatic symptoms - hold diuretics today, reasess tomorrow. Would not use any further IV diuretics, will need to decide when to initiate oral regimen   3. Valvular heart disease - moderate MR, mild TR. Appears more significant valve dysfunction was reported on his echo from outside hospital - follow with management of his cardiomyopathy  For questions or updates, please contact [facility] Please consult [email or web address] for contact info under        Signed, Dina RichBranch, Cordera Stineman, MD  04/23/2018, 10:06 AM

## 2018-04-23 NOTE — Progress Notes (Addendum)
PROGRESS NOTE  Ross HearingBobby L Ennen ONG:[id number] DOB: 12/27/1935 DOA: 04/09/2018 PCP: Oval Linseyondiego, [name], MD  HPI/Recap of past 24 hours: Ross Sweeney is a 82 y.o. male with medical history significant of DM; afib; and CHF presenting after being sent by PCP. Dr. Janna Archondiego did an echo this AM and said since the last one - 8-9 years ago - something caused the heart muscle to weaken and so he sent him in.  He has afib and it "runs wide open all the time."  The patinet isn't sure why he did the echo, but he does have DOE and LE edema.  He can not walk through his house without feeling SOB.  Symptoms have been going on "this year."  Occasional nonproductive cough.  +wheezing, occasional, most days.  +LE edema x 3-4 weeks.  No weight gain since the edema started but his weight is up prior.  He had been started "on a pill to take it off but I told him this morning it wasn't taking it off."  His weight this AM was 196, usual weight 170-185.  No orthopnea.  No PND.  He wakes up thirsty.  He only feels SOB with ambulation.  04/19/2018: Stress test done.  No new findings. 04/20/2018:  Subtherapeutic INR 1.50.  Coumadin restarted. 04/21/18: Meds being adjusted by cardiology. 04/22/18: Needs more diuresis per cardiology.  04/23/2018: Patient seen and examined at his bedside.  He feels weak.  Hypotensive with A. fib RVR, heart rate in the 140s.  Cardiology made aware.  Will obtain stat twelve-lead EKG and start albumin infusion to maintain map greater than 65.  Assessment/Plan: Principal Problem:   Acute on chronic systolic CHF (congestive heart failure) (HCC) Active Problems:   Diabetes mellitus without complication (HCC)   A-fib (HCC)   CKD (chronic kidney disease), stage III (HCC)  Acute on chronic combined diastolic and systolic CHF Prior history of LVEF 45% Had a reported echo with LVEF of 21% Repeated 2D echo done on 04/21/18 revealed LVEF 35-40% w diffused hypokinesis, improved after diuresis Negative  stress test on 04/19/2018 Hold Lasix 40 mg twice daily until reinstituted by cardiology -16.8 kg since admission Cardiology following  Hypotension/orthostatic hypotension Suspect iatrogenic secondary to dehydration Positive orthostatic vital signs Fall precautions Hold Lasix Toprol-XL dose reduced to 25 mg twice daily by cardiology this morning Start albumin infusion to maintain map greater than 65  Chronic A. fib with RVR In rapid ventricular rate Obtain twelve-lead EKG Reduced dose of Toprol-XL from 200 mg daily to 25 mg twice daily due to hypotension per cardiology Continue digoxin 0.125 mg oral daily Continue to closely monitor on telemetry  Subtherapeutic INR, improving INR 1.91 from 1.65 from 1.50 C/w Coumadin Pharmacy managing Coumadin INR goal between 2 and 3  Mitral regurgitation/tricuspid regurgitation Cardiology suspects this is likely functional  Hyperlipidemia Continue Crestor  Type 2 diabetes A1c 6.1 Appears well controlled on metformin at home Sensitive insulin sliding scale Avoid hypoglycemia  Worsening AKI on CKD 3, suspect prerenal secondary to dehydration Baseline creatinine appears to be 1.38 continue with GFR of 48 Presented with creatinine of 1.87 and GFR of 33 Creatinine today 1.42 from 1.26 from  Repeat BMP in the morning  Hypomagnesemia Magnesium 1.5, repleted Repleted with 2 g IV magnesium once Repeat level in the am  Left ankle pain Xray revealed calcaneal spurring Soft tissue swelling wo acute bony abnormalities Pain management with oxy 5mg  as needed for moderate to severe and breakthrough pain Hold off IV  opiates due to hypotension Continue to monitor   DVT prophylaxis:  Coumadin Code Status:  DNR - Family Communication:  None at bedside Disposition Plan:   Home when cardiology signs off. Possibly tomorrow October 03, 2017. Consults called: Cardiology;     Objective: Vitals:   04/23/18 1011 04/23/18 1059 04/23/18 1100 04/23/18  1219  BP: 96/63   (!) 87/48  Pulse: (!) 127 88 100 (!) 127  Resp:    18  Temp:    98.2 F (36.8 C)  TempSrc:    Oral  SpO2: 98%   98%  Weight:      Height:        Intake/Output Summary (Last 24 hours) at 04/23/2018 1333 Last data filed at 04/23/2018 1020 Gross per 24 hour  Intake 1440 ml  Output 2250 ml  Net -810 ml   Filed Weights   04/21/18 0524 04/22/18 0705 04/23/18 0629  Weight: 75.4 kg 73.6 kg 72.8 kg    Exam:  . General: 82 y.o. year-old male well-developed well-nourished in no acute distress.  Alert and oriented x3. . Cardiovascular: Irregular rate and rhythm with no rubs or gallops.  No JVD or thyromegaly noted. Marland Kitchen. Respiratory: Clear to auscultation with no wheezes or rales.  Good inspiratory effort. . Abdomen: Soft nontender nondistended with normal bowel sounds x4 quadrants. . Musculoskeletal: No lower extremity edema. 2/4 pulses in all 4 extremities. . Skin: No ulcerative lesions noted or rashes . Psychiatry: Mood is appropriate for condition and setting   Data Reviewed: CBC: Recent Labs  Lab 04/03/2018 2327 04/19/18 0349 04/20/18 0345 04/21/18 0540 04/22/18 0413 04/23/18 0647  WBC 6.9 6.5 7.1 6.5 7.1 8.7  NEUTROABS 4.3  --   --   --   --   --   HGB 11.6* 11.6* 12.0* 12.1* 12.1* 10.3*  HCT 38.2* 36.0* 36.5* 38.6* 37.8* 32.6*  MCV 96.2 93.3 93.4 93.0 95.2 95.0  PLT 132* 126* 143* 134* 143* 168   Basic Metabolic Panel: Recent Labs  Lab 04/19/18 0349 04/20/18 0345 04/21/18 0540 04/22/18 0719 04/23/18 0647  NA 138 139 138 138 134*  K 3.9 4.0 3.3* 4.3 3.4*  CL 102 96* 95* 97* 93*  CO2 27 32 32 31 26  GLUCOSE 122* 90 90 90 142*  BUN 38* 27* 20 18 22   CREATININE 1.73* 1.38* 1.35* 1.26* 1.42*  CALCIUM 8.1* 7.6* 7.6* 7.6* 7.5*  MG 1.5* 1.7  --   --  1.5*   GFR: Estimated Creatinine Clearance: 38.8 mL/min (A) (by C-G formula based on SCr of 1.42 mg/dL (H)). Liver Function Tests: No results for input(s): AST, ALT, ALKPHOS, BILITOT, PROT, ALBUMIN  in the last 168 hours. No results for input(s): LIPASE, AMYLASE in the last 168 hours. No results for input(s): AMMONIA in the last 168 hours. Coagulation Profile: Recent Labs  Lab 04/19/18 0349 04/20/18 0345 04/21/18 0540 04/22/18 0413 04/23/18 0647  INR 1.93 1.50 1.59 1.65 1.91   Cardiac Enzymes: No results for input(s): CKTOTAL, CKMB, CKMBINDEX, TROPONINI in the last 168 hours. BNP (last 3 results) No results for input(s): PROBNP in the last 8760 hours. HbA1C: No results for input(s): HGBA1C in the last 72 hours. CBG: Recent Labs  Lab 04/19/18 0746 04/19/18 1135 04/19/18 1629 04/22/18 2049 04/23/18 0758  GLUCAP 107* 159* 167* 159* 143*   Lipid Profile: No results for input(s): CHOL, HDL, LDLCALC, TRIG, CHOLHDL, LDLDIRECT in the last 72 hours. Thyroid Function Tests: No results for input(s): TSH, T4TOTAL, FREET4, T3FREE, THYROIDAB in the  last 72 hours. Anemia Panel: No results for input(s): VITAMINB12, FOLATE, FERRITIN, TIBC, IRON, RETICCTPCT in the last 72 hours. Urine analysis: No results found for: COLORURINE, APPEARANCEUR, LABSPEC, PHURINE, GLUCOSEU, HGBUR, BILIRUBINUR, KETONESUR, PROTEINUR, UROBILINOGEN, NITRITE, LEUKOCYTESUR Sepsis Labs: @LABRCNTIP (procalcitonin:4,lacticidven:4)  ) Recent Results (from the past 240 hour(s))  Blood Culture (routine x 2)     Status: None   Collection Time: 05-16-2018  5:50 PM  Result Value Ref Range Status   Specimen Description BLOOD LEFT HAND  Final   Special Requests   Final    BOTTLES DRAWN AEROBIC AND ANAEROBIC Blood Culture results may not be optimal due to an excessive volume of blood received in culture bottles   Culture   Final    NO GROWTH 5 DAYS Performed at [facility], [address] [place], [place] [address]    Report Status 04/22/2018 FINAL  Final  Blood Culture (routine x 2)     Status: None   Collection Time: 2018/05/16  5:50 PM  Result Value Ref Range Status   Specimen Description BLOOD RIGHT HAND   Final   Special Requests   Final    BOTTLES DRAWN AEROBIC AND ANAEROBIC Blood Culture results may not be optimal due to an excessive volume of blood received in culture bottles   Culture   Final    NO GROWTH 5 DAYS Performed at [facility], [address], [place], [place] [address]    Report Status 04/22/2018 FINAL  Final  Urine culture     Status: Abnormal   Collection Time: 2018/05/16  9:42 PM  Result Value Ref Range Status   Specimen Description URINE, CATHETERIZED  Final   Special Requests   Final    NONE Performed at [facility], [address] [place], [place] [address]    Culture MULTIPLE SPECIES PRESENT, SUGGEST RECOLLECTION (A)  Final   Report Status 04/19/2018 FINAL  Final      Studies: Dg Ankle Complete Left  Result Date: 04/23/2018 CLINICAL DATA:  Larey Seat this morning walking to the bathroom, pain near anterior talus EXAM: LEFT ANKLE COMPLETE - 3+ VIEW COMPARISON:  None FINDINGS: Scattered soft tissue swelling greatest anteriorly onto the dorsal foot. Mild osseous demineralization. Ankle joint space preserved. Well corticated ossicle identified adjacent to medial malleolus, appears old. Plantar and Achilles insertion calcaneal spur formation. No acute fracture, dislocation, or bone destruction. Extensive small vessel vascular calcifications. IMPRESSION: Calcaneal spurring. Soft tissue swelling without acute bony abnormalities. Electronically Signed   By: Ulyses Southward M.D.   On: 04/23/2018 11:23    Scheduled Meds: . digoxin  0.125 mg Oral Daily  . hydrocortisone cream   Topical BID  . metoprolol succinate  25 mg Oral BID  . potassium chloride  40 mEq Oral Daily  . rosuvastatin  20 mg Oral q1800  . sodium chloride flush  3 mL Intravenous Q12H  . warfarin  5 mg Oral ONCE-1800  . Warfarin - Pharmacist Dosing Inpatient   Does not apply q1800    Continuous Infusions: . sodium chloride 250 mL (04/19/18 0618)  . sodium chloride 250 mL (04/23/18 1051)  . albumin  human    . heparin 1,100 Units/hr (04/23/18 0844)     LOS: 6 days     Darlin Drop, MD [facility] Pager [phone]  If 7PM-7AM, please contact night-coverage [email or web address] Password [email or web address] 04/23/2018, 1:33 PM

## 2018-04-24 ENCOUNTER — Inpatient Hospital Stay (HOSPITAL_COMMUNITY): Payer: Medicare Other

## 2018-04-24 DIAGNOSIS — R400 Somnolence: Secondary | ICD-10-CM

## 2018-04-24 DIAGNOSIS — N179 Acute kidney failure, unspecified: Secondary | ICD-10-CM

## 2018-04-24 DIAGNOSIS — R571 Hypovolemic shock: Secondary | ICD-10-CM

## 2018-04-24 DIAGNOSIS — R17 Unspecified jaundice: Secondary | ICD-10-CM

## 2018-04-24 LAB — BASIC METABOLIC PANEL
Anion gap: 15 (ref 5–15)
BUN: 40 mg/dL — ABNORMAL HIGH (ref 8–23)
CHLORIDE: 99 mmol/L (ref 98–111)
CO2: 21 mmol/L — ABNORMAL LOW (ref 22–32)
Calcium: 8 mg/dL — ABNORMAL LOW (ref 8.9–10.3)
Creatinine, Ser: 2.67 mg/dL — ABNORMAL HIGH (ref 0.61–1.24)
GFR calc Af Amer: 25 mL/min — ABNORMAL LOW (ref >60)
GFR calc non Af Amer: 22 mL/min — ABNORMAL LOW (ref >60)
Glucose, Bld: 168 mg/dL — ABNORMAL HIGH (ref 70–99)
Potassium: 5.1 mmol/L (ref 3.5–5.1)
Sodium: 135 mmol/L (ref 135–145)

## 2018-04-24 LAB — CBC WITH DIFFERENTIAL/PLATELET
BASOS ABS: 0 10*3/uL (ref 0.0–0.1)
Band Neutrophils: 0 %
Basophils Relative: 0 %
Blasts: 0 %
Eosinophils Absolute: 0 10*3/uL (ref 0.0–0.5)
Eosinophils Relative: 0 %
HCT: 10.3 % — ABNORMAL LOW (ref 39.0–52.0)
Hemoglobin: 3.2 g/dL — CL (ref 13.0–17.0)
Lymphocytes Relative: 10 %
Lymphs Abs: 1.1 10*3/uL (ref 0.7–4.0)
MCH: 30.5 pg (ref 26.0–34.0)
MCHC: 31.1 g/dL (ref 30.0–36.0)
MCV: 98.1 fL (ref 80.0–100.0)
Metamyelocytes Relative: 1 %
Monocytes Absolute: 1.4 10*3/uL — ABNORMAL HIGH (ref 0.1–1.0)
Monocytes Relative: 13 %
Myelocytes: 0 %
Neutro Abs: 8.3 10*3/uL — ABNORMAL HIGH (ref 1.7–7.7)
Neutrophils Relative %: 76 %
Other: 0 %
Platelets: 113 10*3/uL — ABNORMAL LOW (ref 150–400)
Promyelocytes Relative: 0 %
RBC: 1.05 MIL/uL — AB (ref 4.22–5.81)
RDW: 15.5 % (ref 11.5–15.5)
WBC: 10.8 10*3/uL — ABNORMAL HIGH (ref 4.0–10.5)
nRBC: 0 % (ref 0.0–0.2)
nRBC: 0 /100 WBC

## 2018-04-24 LAB — MAGNESIUM: Magnesium: 2.6 mg/dL — ABNORMAL HIGH (ref 1.7–2.4)

## 2018-04-24 LAB — PROTIME-INR
INR: 3.3
INR: 4.17
Prothrombin Time: 33.1 seconds — ABNORMAL HIGH (ref 11.4–15.2)
Prothrombin Time: 39.6 seconds — ABNORMAL HIGH (ref 11.4–15.2)

## 2018-04-24 LAB — COMPREHENSIVE METABOLIC PANEL
ALT: 25 U/L (ref 0–44)
AST: 83 U/L — ABNORMAL HIGH (ref 15–41)
Albumin: 4.1 g/dL (ref 3.5–5.0)
Alkaline Phosphatase: 27 U/L — ABNORMAL LOW (ref 38–126)
Anion gap: 20 — ABNORMAL HIGH (ref 5–15)
BUN: 39 mg/dL — ABNORMAL HIGH (ref 8–23)
CALCIUM: 7.4 mg/dL — AB (ref 8.9–10.3)
CO2: 13 mmol/L — AB (ref 22–32)
Chloride: 104 mmol/L (ref 98–111)
Creatinine, Ser: 2.7 mg/dL — ABNORMAL HIGH (ref 0.61–1.24)
GFR calc Af Amer: 25 mL/min — ABNORMAL LOW (ref >60)
GFR calc non Af Amer: 21 mL/min — ABNORMAL LOW (ref >60)
Glucose, Bld: 177 mg/dL — ABNORMAL HIGH (ref 70–99)
Potassium: 5.4 mmol/L — ABNORMAL HIGH (ref 3.5–5.1)
Sodium: 137 mmol/L (ref 135–145)
TOTAL PROTEIN: 5.4 g/dL — AB (ref 6.5–8.1)
Total Bilirubin: 2.1 mg/dL — ABNORMAL HIGH (ref 0.3–1.2)

## 2018-04-24 LAB — APTT: aPTT: >160 seconds — ABNORMAL HIGH (ref 24–36)

## 2018-04-24 LAB — HEPARIN LEVEL (UNFRACTIONATED): Heparin Unfractionated: 0.41 IU/mL (ref 0.30–0.70)

## 2018-04-24 LAB — AMMONIA: Ammonia: 17 umol/L (ref 9–35)

## 2018-04-24 LAB — GLUCOSE, CAPILLARY: Glucose-Capillary: 176 mg/dL — ABNORMAL HIGH (ref 70–99)

## 2018-04-24 MED ORDER — SODIUM CHLORIDE 0.9 % IV BOLUS
250.0000 mL | Freq: Once | INTRAVENOUS | Status: AC
  Administered 2018-04-24: 250 mL via INTRAVENOUS

## 2018-04-24 MED ORDER — AMIODARONE HCL IN DEXTROSE 360-4.14 MG/200ML-% IV SOLN
INTRAVENOUS | Status: AC
  Administered 2018-04-24: 60 mg/h
  Filled 2018-04-24: qty 200

## 2018-04-24 MED ORDER — AMIODARONE HCL IN DEXTROSE 360-4.14 MG/200ML-% IV SOLN: 60.0000 mg/h | INTRAVENOUS | Status: DC

## 2018-04-24 MED ORDER — AMIODARONE LOAD VIA INFUSION
150.0000 mg | Freq: Once | INTRAVENOUS | Status: AC
  Administered 2018-04-24: 150 mg via INTRAVENOUS
  Filled 2018-04-24: qty 83.34

## 2018-04-24 MED ORDER — SODIUM CHLORIDE 0.9 % IV BOLUS: 250.0000 mL | Freq: Once | INTRAVENOUS | Status: DC

## 2018-04-24 MED ORDER — SODIUM CHLORIDE 0.9 % IV BOLUS: 1000.0000 mL | Freq: Once | INTRAVENOUS | Status: DC

## 2018-04-24 MED ORDER — SODIUM CHLORIDE 0.9 % IV BOLUS
500.0000 mL | Freq: Once | INTRAVENOUS | Status: AC
  Administered 2018-04-24: 500 mL via INTRAVENOUS

## 2018-04-24 MED ORDER — METOPROLOL TARTRATE 5 MG/5ML IV SOLN: 2.5000 mg | Freq: Once | INTRAVENOUS | Status: DC

## 2018-04-24 MED ORDER — AMIODARONE HCL IN DEXTROSE 360-4.14 MG/200ML-% IV SOLN: 30.0000 mg/h | INTRAVENOUS | Status: DC

## 2018-05-03 NOTE — Progress Notes (Addendum)
MD from [facility] called-  Concerned that BP is still on the Low side.  Wanted us to hold the evening metoprolol for today to get the BP back up.   Let the Night nurse know at handoff.  Also paged Cardiologist to let them know

## 2018-05-03 NOTE — Progress Notes (Signed)
Progress Note  Patient Name: Ross Sweeney Date of Encounter: May 07, 2018  Primary Cardiologist: New, to establish in [place]  Subjective   Hypotensive overnight- now in afib with RVR. Presented with CHF and known chronic atrial fibrillation since 2006 - has diuresed 16L negative. Admitted on 12/16. Was orthostatic yesterday. On toprol and digoxin and has been on warfarin with subtherapeutic INR - also on heparin. Diuretics held yesterday. Echo on 12/20 shows LVEF 35-40% with moderate MR, severe LAE and moderate RAE, mild AI, global HK.  He is disoriented today.  On arrival in the room he was only oriented to self.  He is in Trendelenburg position.  He is notable to be jaundiced with significant peripheral petechiae.  He is on heparin and warfarin.  Acute jump in creatinine overnight from 1.42-2.67.  Potassium 5.1.  INR 3.3.  Inpatient Medications    Scheduled Meds: . digoxin  0.125 mg Oral Daily  . hydrocortisone cream   Topical BID  . metoprolol succinate  25 mg Oral BID  . metoprolol tartrate  2.5 mg Intravenous Once  . potassium chloride  40 mEq Oral Daily  . rosuvastatin  20 mg Oral q1800  . senna-docusate  1 tablet Oral BID  . sodium chloride flush  3 mL Intravenous Q12H  . Warfarin - Pharmacist Dosing Inpatient   Does not apply q1800   Continuous Infusions: . sodium chloride 250 mL (04/19/18 0618)  . sodium chloride 250 mL (04/23/18 1051)  . heparin 1,000 Units/hr (04/23/18 1637)  . sodium chloride 500 mL (May 07, 2018 0805)   PRN Meds: sodium chloride, sodium chloride, acetaminophen, ondansetron (ZOFRAN) IV, oxyCODONE, sodium chloride flush   Vital Signs    Vitals:   07-May-2018 0701 2018/05/07 0755 05/07/18 0759 05/07/18 0800  BP: (!) 70/52 (!) 78/50 (!) 96/56 (!) 83/47  Pulse:      Resp:      Temp:      TempSrc:      SpO2:      Weight:      Height:        Intake/Output Summary (Last 24 hours) at May 07, 2018 0813 Last data filed at 05-07-18 0202 Gross per 24  hour  Intake 1200 ml  Output 200 ml  Net 1000 ml   Filed Weights   04/22/18 0705 04/23/18 0629 May 07, 2018 0627  Weight: 73.6 kg 72.8 kg 68 kg    Telemetry    afib and elevated rates - Personally Reviewed  ECG    na  Physical Exam   General appearance: alert, delirious, icteric and Jaundiced, mild distress, agitated Neck: no carotid bruit, no JVD and thyroid not enlarged, symmetric, no tenderness/mass/nodules Lungs: clear to auscultation bilaterally Heart: Irregularly irregular tachycardia Abdomen: Mild tender to palpation with rebound and involuntary guarding in the left lower quadrant, no flank ecchymosis or fluid wave Extremities: Trace edema, significant lower extremity bilateral petechiae Pulses: Thready pulses Skin: Jaundiced, lower extremity bilateral petechiae Neurologic: Mental status: Confused, oriented only to self, agitated Psych: Cannot assess   Labs    Chemistry Recent Labs  Lab 04/22/18 0719 04/23/18 0647 05/07/18 0615  NA 138 134* 135  K 4.3 3.4* 5.1  CL 97* 93* 99  CO2 31 26 21*  GLUCOSE 90 142* 168*  BUN 18 22 40*  CREATININE 1.26* 1.42* 2.67*  CALCIUM 7.6* 7.5* 8.0*  GFRNONAA 54* 46* 22*  GFRAA >60 54* 25*  ANIONGAP 10 15 15      Hematology Recent Labs  Lab 04/21/18 0540 04/22/18 0413  04/23/18 0647  WBC 6.5 7.1 8.7  RBC 4.15* 3.97* 3.43*  HGB 12.1* 12.1* 10.3*  HCT 38.6* 37.8* 32.6*  MCV 93.0 95.2 95.0  MCH 29.2 30.5 30.0  MCHC 31.3 32.0 31.6  RDW 15.0 15.1 14.9  PLT 134* 143* 168    Cardiac EnzymesNo results for input(s): TROPONINI in the last 168 hours.  Recent Labs  Lab 05/01/2018 1256  TROPIPOC 0.02     BNP Recent Labs  Lab 04/14/2018 1624  BNP 1,380.5*     DDimer No results for input(s): DDIMER in the last 168 hours.   Radiology    Dg Ankle Complete Left  Result Date: 04/23/2018 CLINICAL DATA:  Larey SeatFell this morning walking to the bathroom, pain near anterior talus EXAM: LEFT ANKLE COMPLETE - 3+ VIEW COMPARISON:   None FINDINGS: Scattered soft tissue swelling greatest anteriorly onto the dorsal foot. Mild osseous demineralization. Ankle joint space preserved. Well corticated ossicle identified adjacent to medial malleolus, appears old. Plantar and Achilles insertion calcaneal spur formation. No acute fracture, dislocation, or bone destruction. Extensive small vessel vascular calcifications. IMPRESSION: Calcaneal spurring. Soft tissue swelling without acute bony abnormalities. Electronically Signed   By: Ulyses SouthwardMark  Sweeney M.D.   On: 04/23/2018 11:23    Cardiac Studies   LV EF: 35% -   40%  ------------------------------------------------------------------- Indications:      Mitral regurgitation 424.0.  ------------------------------------------------------------------- History:   PMH:   Atrial fibrillation.  Congestive heart failure. Risk factors:  Diabetes mellitus.  ------------------------------------------------------------------- Study Conclusions  - Left ventricle: The cavity size was mildly dilated. Wall   thickness was increased in a pattern of mild LVH. Systolic   function was moderately reduced. The estimated ejection fraction   was in the range of 35% to 40%. Diffuse hypokinesis. - Aortic valve: There was mild regurgitation. - Aortic root: The aortic root was mildly dilated. - Mitral valve: There was moderate regurgitation. - Left atrium: The atrium was severely dilated. - Right ventricle: The cavity size was mildly dilated. - Right atrium: The atrium was moderately dilated. - Pulmonary arteries: Systolic pressure was mildly increased. PA   peak pressure: 49 mm Hg (S).  Impressions:  - Moderate global reduction in LV systolic function; mild LVE; mild   LVH; mild AI; mildly dilated aortic root; moderate MR; biatrial   enlargement; mild RVE; mild TR with mild pulmonary hypertension.  Patient Profile     Ross Sweeney is an [age]M with chronic atrial fibrillation, diabetes and  hyperlipidemia here with newly diagnosed acute systolic heart failure and atrial fibrillation with RVR.   Assessment & Plan    1.  Hypotension, altered mental status -Developed hypotension and altered mental status last night.  Now appears to be in acute renal failure with rising creatinine from 1.42-2.67 overnight.  This acute hyperkalemia.  We will hold potassium supplementation.  I will check CMET.  Concern for possible intra-abdominal bleeding with elevated INR of 3.3 on heparin and warfarin.  He does have rebound and guarding on exam which is suggestive of possible hemorrhage.  He does appear hypovolemic and had been diuresed.  He has been receiving albumin and IV fluids.  Will start IV normal saline pending stat labs to assess for possible bleeding.  2.  Abdominal pain -Acute abdominal tenderness today on exam with rebound and involuntary guarding in the setting of jaundice and acute renal failure suggestive of possible intra-abdominal hemorrhage.  We will plan CT abdomen without contrast as well as head CT given altered mental status.  Stat CBC and labs including pro time INR, ammonia and hold heparin and warfarin.  2. Chronic afib - long history dating back to 2006 according to notes - elevated rates this admission - rate control with toprol and digoxin. Toprol held yesterday due to soft bp's. Did receive IV lopressor once yesterday and IV dilt. Would avoid any additional IV dilt due to systolic dysfunction - on coumadin for stroke prevention. Subtherapeutic INR, on hep gtt. INR 1.9, likely can come off heparin tomorrow.  - BP low yesterday and trended low overnight - BB held. Given IVF and albumin for presumed over-diuresis. No fever. Rate control is suboptimal. Will d/c BB and proceed with amiodarone.  3. Acute combined systolic/diastolic HF - 04/2018 echo LVEF 35-40%, diffuse hypokinesis,  Mod MR, PASP 49 - suspected tachycardia mediated due to poorly controlled afib - 04/2018 nuclear  stress without evidence of significant ishcemic heart disease - hold additional diuresis d/t hypotension - fluids have been given back.  4. Valvular heart disease - moderate MR, mild TR. Appears more significant valve dysfunction was reported on his echo from outside hospital - follow with management of his cardiomyopathy  CRITICAL CARE TIME: I have spent a total of 45 minutes with patient reviewing hospital notes, telemetry, EKGs, labs and examining the patient as well as establishing an assessment and plan that was discussed with the patient. > 50% of time was spent in direct patient care. The patient is critically ill with multi-organ system failure and requires high complexity decision making for assessment and support, frequent evaluation and titration of therapies, application of advanced monitoring technologies and extensive interpretation of multiple databases.  For questions or updates, please contact [facility] Please consult [email or web address] for contact info under   Chrystie NoseKenneth C. Talisha Erby, MD, Milagros LollFACC, FACP  [place]  [facility]  Medical Director of the [facility] Lip[facility]  [facility]ovascular Risk Reduction Clinic Diplomate of the American Board of Clinical Lipidology Attending Cardiologist  Direct Dial: [phone]  Fax: [phone]  Website:  [email or web address]  Chrystie NoseKenneth C Gisele Pack, MD  01-24-2018, 8:13 AM

## 2018-05-03 NOTE — Progress Notes (Signed)
DR. Margo AyeHall notified of bp and hr. 250 NS bolus and cardizem gtt ordered. Notify cardiology

## 2018-05-03 NOTE — Care Management Important Message (Deleted)
Important Message  Patient Details  Name: Vickki HearingBobby L Rehberg MRN: [id number] Date of Birth: 05/18/1935   Medicare Important Message Given:  Yes    Selassie Spatafore P Tiombe Tomeo 2018/01/16, 3:00 PM

## 2018-05-03 NOTE — Progress Notes (Signed)
Called to bedside to see patient around 10:00 am - he is noted to be in marked sinus bradycardia with HR in the 20's/30's. Jaundice has worsened. As expected, the CT shows RP bleeding. Hypotension is worse. Unfortunately, he is not likely to survive. Patient wished to be DNR - therefore will not pursue further aggressive interventions. Dr. Margo AyeHall was also present at the bedside. Expect in-hospital death.  Chrystie NoseKenneth C. Hilty, MD, [facility] Dba Metropolitan Surgery CenterFACC, FACP  [place]  [facility]  Medical Director of the Advanced Lipid Disorders &  Cardiovascular Risk Reduction Clinic Diplomate of the American Board of Clinical Lipidology Attending Cardiologist  Direct Dial: [phone]  Fax: [phone]  Website:  [email or web address]

## 2018-05-03 NOTE — Progress Notes (Signed)
Pt to transfer from 3E.Marland Kitchen. Pt came from CT with Rapid response team escorted pt on transfer to 2c. Upon arrival to unit, pt appears jaundice/pale with hypotension, labored breathing, pt is not responsive to stimuli, pupils non-reactive. Rapid response verbilized that this was a change from prior. Primary Md made aware. Orders for bolus, prior to bolus being hung HR dropped into the 30s, cardiology to bedside at that time. Family brought into room. Pts HR continued to drop, pt asystole at 1003. Primary MD at bedside at that time and called TOD along with RR nurse Debbie.

## 2018-05-03 NOTE — Death Summary Note (Signed)
Death Summary  Ross Sweeney ZOX:[id number] DOB: 02/08/1936 DOA: 04/10/2018  PCP: Oval LinseyondieVickki Hearinggo, [name], MD  Admit date: 04/03/2018 Date of Death: May 04, 2017 Time of Death: 1003 Notification: Oval Linseyondiego, [name], MD notified of death of May 04, 2017   History of present illness:  Ross Sweeney is a 83 y.o. male with a past medical history significant fortype 2 DM; chronic afib; and CHF presenting after being sent by PCP.Dr. Janna Archondiego did an echo which revealed new LVEF 21%. Endorses intermittent non productive cough, audible wheezing and gradually worsening bilateral lower extremity edema x 4 weeks duration. On presentation, his weight was 196 lbs, usual weight 170-185 lbs. 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 Cardiology consulted and followed.  Nuclear stress test done on 04/19/18 without evidence of significant ischemic heart disease per cardiology. Hospital course complicated by subtherapeutic INR on coumadin bridged with heparin IV.   On 2017-08-29 became severely hypotensive with uncontrolled RVR despite means of stabilization. Stat CT abdomen and pelvis wo contrast revealed suspected spontaneous retroperitoneal hematomas. BP was worse and heart rate dropped in the 20-30's. Patient is DNR. Ross Sweeney expired at [id number] on 2017-08-29.   Final Diagnoses:  1.  Cardiopulmonary arrest 2.  Severe hypotension in the setting of chronic A. fib with RVR 3.  Acute combined systolic/diastolic CHF 4.  Spontaneous retroperitoneal hematomas   The results of significant diagnostics from this hospitalization (including imaging, microbiology, ancillary and laboratory) are listed below for reference.    Significant Diagnostic Studies: Ct Abdomen Pelvis Wo Contrast  Result Date: May 04, 2017 CLINICAL DATA:  Confusion, jaundice.  Abdominal pain. EXAM: CT ABDOMEN AND PELVIS WITHOUT CONTRAST TECHNIQUE: Multidetector CT imaging of the abdomen and pelvis was  performed following the standard protocol without IV contrast. COMPARISON:  None. FINDINGS: Lower chest: Cardiomegaly. Small right pleural effusion. No confluent opacities. Hepatobiliary: No focal hepatic abnormality. Gallbladder unremarkable. Pancreas: No focal abnormality or ductal dilatation. Spleen: No focal abnormality.  Normal size. Adrenals/Urinary Tract: Small nodule in the left adrenal gland measures 18 mm and measures 11 Hounsfield units, most compatible with adenoma. Extensive perinephric stranding bilaterally. Large cyst off the lower pole of the right kidney measures up to 8.1 cm. Small cysts in the left kidney. No hydronephrosis. Urinary bladder unremarkable. Stomach/Bowel: Appendix is normal. Sigmoid diverticulosis. No active diverticulitis. Stomach and small bowel decompressed, unremarkable. Vascular/Lymphatic: Aortic and iliac calcifications. No aneurysm or adenopathy. Reproductive: No visible focal abnormality. Other: Extensive stranding noted in the retroperitoneum bilaterally, left greater than right most compatible with spontaneous retroperitoneal hematoma. The largest dimensions on axial imaging is in the left retroperitoneum measuring 5.8 x 3.8 cm on image 64. Small amount of free fluid in the cul-de-sac. Musculoskeletal: No acute bony abnormality. Degenerative disc and facet disease in the lumbar spine. IMPRESSION: Extensive stranding in the retroperitoneum bilaterally including along the psoas and iliopsoas muscles and in the perinephric space, left greater than right. This is larger in the left retroperitoneum and most compatible with spontaneous retroperitoneal hematomas. Sigmoid diverticulosis. Aortic atherosclerosis. Trace right pleural effusion. Small amount of free fluid in the pelvis. Electronically Signed   By: Charlett NoseKevin  Dover M.D.   On: May 04, 2017 09:50   Dg Ankle Complete Left  Result Date: 04/23/2018 CLINICAL DATA:  Larey SeatFell this morning walking to the bathroom, pain near anterior  talus EXAM: LEFT ANKLE COMPLETE - 3+ VIEW COMPARISON:  None FINDINGS: Scattered soft tissue swelling greatest anteriorly onto the dorsal foot. Mild osseous demineralization. Ankle joint space preserved. Well  corticated ossicle identified adjacent to medial malleolus, appears old. Plantar and Achilles insertion calcaneal spur formation. No acute fracture, dislocation, or bone destruction. Extensive small vessel vascular calcifications. IMPRESSION: Calcaneal spurring. Soft tissue swelling without acute bony abnormalities. Electronically Signed   By: Ulyses SouthwardMark  Boles M.D.   On: 04/23/2018 11:23   Ct Head Wo Contrast  Result Date: December 29, 2017 CLINICAL DATA:  83 year old male with confusion.  Initial encounter. EXAM: CT HEAD WITHOUT CONTRAST TECHNIQUE: Contiguous axial images were obtained from the base of the skull through the vertex without intravenous contrast. COMPARISON:  None. FINDINGS: Brain: Exam is motion degraded. No intracranial hemorrhage. 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 Chronic microvascular changes. No intracranial mass lesion noted on this unenhanced exam. Global atrophy Vascular: Vascular calcifications.  No acute hyperdense vessel. Skull: No skull fracture. Sinuses/Orbits: No acute orbital abnormality. Opacified right maxillary sinus with central hyperdense material suggesting chronic changes with inspissated material. Adjacent bone thickening also consistent with chronic sinusitis. Other: Mastoid air cells and middle ear cavities are clear. IMPRESSION: 1. Exam is motion degraded. 2. No intracranial hemorrhage. 3. 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 4. 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 5. Chronic microvascular changes. 6. Global atrophy. 7. Chronic right maxillary sinusitis. Electronically Signed   By: Lacy DuverneySteven  Olson M.D.   On: December 29, 2017 09:49   Nm Myocar Multi W/spect W/wall Motion / Ef  Result Date:  04/19/2018 CLINICAL DATA:  CAD evaluation EXAM: MYOCARDIAL IMAGING WITH SPECT (REST AND PHARMACOLOGIC-STRESS) GATED LEFT VENTRICULAR WALL MOTION STUDY LEFT VENTRICULAR EJECTION FRACTION TECHNIQUE: Standard myocardial SPECT imaging was performed after resting intravenous injection of 10 mCi Tc-3955m tetrofosmin. Subsequently, intravenous infusion of Lexiscan was performed under the supervision of the Cardiology staff. At peak effect of the drug, 30 mCi Tc-9355m tetrofosmin was injected intravenously and standard myocardial SPECT imaging was performed. Quantitative gated imaging was also performed to evaluate left ventricular wall motion, and estimate left ventricular ejection fraction. COMPARISON:  None. FINDINGS: Perfusion: No decreased activity in the left ventricle on stress imaging to suggest reversible ischemia or infarction. Wall Motion: Generalized hypokinesia Left Ventricular Ejection Fraction: 37 % End diastolic volume 152 ml End systolic volume 95 ml IMPRESSION: 1. No reversible ischemia or infarction. 2. Generalized hypokinesia. 3. Left ventricular ejection fraction 37% 4. Non invasive risk stratification*: Intermediate *2012 Appropriate Use Criteria for Coronary Revascularization Focused Update: J Am Coll Cardiol. 2012;59(9):857-881. http://content.dementiazones.comonlinejacc.org/article.aspx?articleid=1201161 Electronically Signed   By: Charlett NoseKevin  Dover M.D.   On: 04/19/2018 13:09   Dg Chest Port 1 View  Result Date: 04/02/2018 CLINICAL DATA:  History of atrial fibrillation.  Abnormal echo. EXAM: PORTABLE CHEST 1 VIEW COMPARISON:  March 06, 2007 FINDINGS: The cardiac silhouette appears enlarged although is poorly evaluated due to portable technique. No pneumothorax. The lungs are clear. No other acute abnormalities. IMPRESSION: The cardiac silhouette appears enlarged but is poorly evaluated due to portable technique. No other acute abnormalities. Electronically Signed   By: Gerome Samavid  Williams III M.D   On: 04/02/2018 12:33      Microbiology: Recent Results (from the past 240 hour(s))  Blood Culture (routine x 2)     Status: None   Collection Time: 04/14/2018  5:50 PM  Result Value Ref Range Status   Specimen Description BLOOD LEFT HAND  Final   Special Requests   Final    BOTTLES DRAWN AEROBIC AND ANAEROBIC Blood Culture results may not be optimal due to an excessive volume of blood received in culture bottles  Culture   Final    NO GROWTH 5 DAYS Performed at [facility], [address] [place], [place] [address]    Report Status 04/22/2018 FINAL  Final  Blood Culture (routine x 2)     Status: None   Collection Time: 2018/05/01  5:50 PM  Result Value Ref Range Status   Specimen Description BLOOD RIGHT HAND  Final   Special Requests   Final    BOTTLES DRAWN AEROBIC AND ANAEROBIC Blood Culture results may not be optimal due to an excessive volume of blood received in culture bottles   Culture   Final    NO GROWTH 5 DAYS Performed at [facility], [address] [place], [place] [address]    Report Status 04/22/2018 FINAL  Final  Urine culture     Status: Abnormal   Collection Time: 05/01/18  9:42 PM  Result Value Ref Range Status   Specimen Description URINE, CATHETERIZED  Final   Special Requests   Final    NONE Performed at [facility], [address] [place], [place] [address]    Culture MULTIPLE SPECIES PRESENT, SUGGEST RECOLLECTION (A)  Final   Report Status 04/19/2018 FINAL  Final     Labs: Basic Metabolic Panel: Recent Labs  Lab 04/19/18 0349 04/20/18 0345 04/21/18 0540 04/22/18 0719 04/23/18 0647 04/22/2018 0615 04/28/2018 0902  NA 138 139 138 138 134* 135 137  K 3.9 4.0 3.3* 4.3 3.4* 5.1 5.4*  CL 102 96* 95* 97* 93* 99 104  CO2 27 32 32 31 26 21* 13*  GLUCOSE 122* 90 90 90 142* 168* 177*  BUN 38* 27* 20 18 22  40* 39*  CREATININE 1.73* 1.38* 1.35* 1.26* 1.42* 2.67* 2.70*  CALCIUM 8.1* 7.6* 7.6* 7.6* 7.5* 8.0* 7.4*  MG 1.5* 1.7  --   --  1.5*  --  2.6*    Liver Function Tests: Recent Labs  Lab 04/16/2018 0902  AST 83*  ALT 25  ALKPHOS 27*  BILITOT 2.1*  PROT 5.4*  ALBUMIN 4.1   No results for input(s): LIPASE, AMYLASE in the last 168 hours. Recent Labs  Lab 04/21/2018 0902  AMMONIA 17   CBC: Recent Labs  Lab 05/01/2018 2327  04/20/18 0345 04/21/18 0540 04/22/18 0413 04/23/18 0647 04/05/2018 0902  WBC 6.9   < > 7.1 6.5 7.1 8.7 Not Measured  NEUTROABS 4.3  --   --   --   --   --  Not Measured  HGB 11.6*   < > 12.0* 12.1* 12.1* 10.3* Not Measured  HCT 38.2*   < > 36.5* 38.6* 37.8* 32.6* Not Measured  MCV 96.2   < > 93.4 93.0 95.2 95.0 Not Measured  PLT 132*   < > 143* 134* 143* 168 Not Measured   < > = values in this interval not displayed.   Cardiac Enzymes: No results for input(s): CKTOTAL, CKMB, CKMBINDEX, TROPONINI in the last 168 hours. D-Dimer No results for input(s): DDIMER in the last 72 hours. BNP: Invalid input(s): POCBNP CBG: Recent Labs  Lab 04/19/18 1135 04/19/18 1629 04/22/18 2049 04/23/18 0758 04/12/2018 0809  GLUCAP 159* 167* 159* 143* 176*   Anemia work up No results for input(s): VITAMINB12, FOLATE, FERRITIN, TIBC, IRON, RETICCTPCT in the last 72 hours. Urinalysis No results found for: COLORURINE, APPEARANCEUR, LABSPEC, PHURINE, GLUCOSEU, HGBUR, BILIRUBINUR, KETONESUR, PROTEINUR, UROBILINOGEN, NITRITE, LEUKOCYTESUR Sepsis Labs Invalid input(s): PROCALCITONIN,  WBC,  LACTICIDVEN     SIGNED:  Oliver Pila  Margo Aye, MD  [facility] 05-10-2018, 12:01 PM Pager   If 7PM-7AM, please contact night-coverage [email or web address] Password [email or web address]

## 2018-05-03 NOTE — Progress Notes (Signed)
Patient BP 92\60. HR 60.  250 NS bolus give as ordered by oncall NP. MD notified.

## 2018-05-03 NOTE — Progress Notes (Addendum)
While making rounds, HR 130-150s, sbp 70's manually, paged Dr hall and at bedside, pt will awaken to voice, confused, noted to be pale and jaundiced, pettecia in feet, unable to palpate pedal pulse, swat nurse at bedside and rapid response team at bedside, NS IV infusing, heparin infusing, family at bedside and updated on pt status, see rapid note

## 2018-05-03 NOTE — Progress Notes (Addendum)
Orthostatics done- pt is positive.  Paged MD to let her know.    MD page back- will add albumin IV

## 2018-05-03 NOTE — Progress Notes (Signed)
Attempted to call report but nurse  [place] with pt and will call back, pt now going to CT with swat and rapid nurse, called unit again to give report Chreshenda Nolen MuMcKinney RN

## 2018-05-03 NOTE — Significant Event (Signed)
Rapid Response Event Note  Overview:  Called to assist with patient with hypotension Time Called: 0752 Arrival Time: 0754 Event Type: Hypotension  Initial Focused Assessment:  Supine in bed - jaundiced skin color - skin warm and dry except for cool hands - opens eyes to name -moves all 4's  - denies pain - confused oriented only to name - jerking movements of arms and legs -abd soft - denies pain with palpation - some petechiae noted feet -  mottling of knees - NS infusing wide open - Heparin gtt infusing.  BP 78/50 Afib on monitor rate 140  RR 24 - difficult to get O2 sats - bil BS = clear - last reading 95%.  Dr. Margo AyeHall at bedside. RN states no voiding overnight - bladder scan revealed 300 cc. CBG 176.  Windell Mouldinguth RN at bedside assisting with care.     Interventions:  Continue NS bolus - Heparin gtt stopped - BP checked in both arms - right arm 10 points higher than left - stat labs drawn - Albumin 25% x 4 bottles given per Dr. Margo AyeHall - 12 lead EKG done.  BP responding somewhat to fluids - MAP >60 - neuro status no better - daughter to bedside - cannot call her name.  Dr. Rennis GoldenHilty to bedside - LOC decreasing some - now with grimace and guarding with abd exam - BP very volume dependent.  Amiodarone bolus 150 mg IV given over 20 minutes then at 60 mg/hr.  HR down to 116-120.  2nd liter NS bolus finishing.  BP now 120/109 - HR 113. 3rd NS bolus hung per Dr. Rennis GoldenHilty.  To CT scan for abdomen and brain - post scan with furrowed brow - resps more labored rate 32 - lungs remain clear - responds only to pain now - to 2C02.  Quickly becoming less responsive - resps becoming agonal - family to bedside - HR blocking down - Amiodarone turned off - Dr. Margo AyeHall stat paged - to bedside.  Patient actively dying - Dr. Rennis GoldenHilty to bedside - both spoke with family who honor DNR status - patient without distress.  Family and staff supported - handoff to Tesoro CorporationCreshandra RN.    Plan of Care (if not transferred):  Event Summary: Name of Physician  Notified: Dr. Margo AyeHall  at (pta RRT)  Name of Consulting Physician Notified: Dr. Rennis GoldenHilty at (per Dr. Margo AyeHall)  Outcome: Transferred (Comment)  Event End Time: 1015  Delton PrairieBritt, Caeleigh Prohaska L

## 2018-05-03 DEATH — deceased

## 2018-05-05 ENCOUNTER — Ambulatory Visit: Payer: Medicare Other | Admitting: Physician Assistant

## 2019-04-12 IMAGING — CT CT HEAD W/O CM
4 series · 16 of 47 positions shown, 18 images · non-contrast
Comparison: None.

CLINICAL DATA: 82-year-old male with confusion.  Initial encounter.

EXAM:
CT HEAD WITHOUT CONTRAST
TECHNIQUE: Contiguous axial images were obtained from the base of the skull
through the vertex without intravenous contrast.

[Series 3: head without · axial · non-contrast · 0.42mm/px · z∈[-78,+42]mm · 7 of 33 slices shown, 9 images]
[im 5/33  brain]
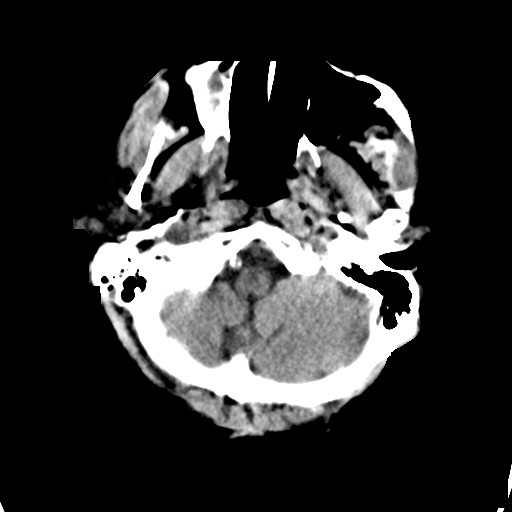
[im 5/33  bone]
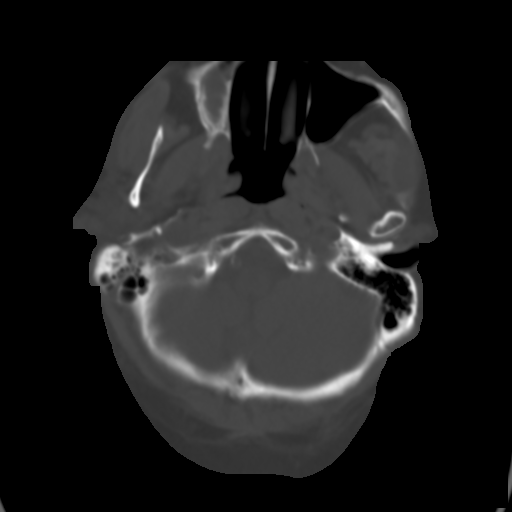
[im 9/33  brain]
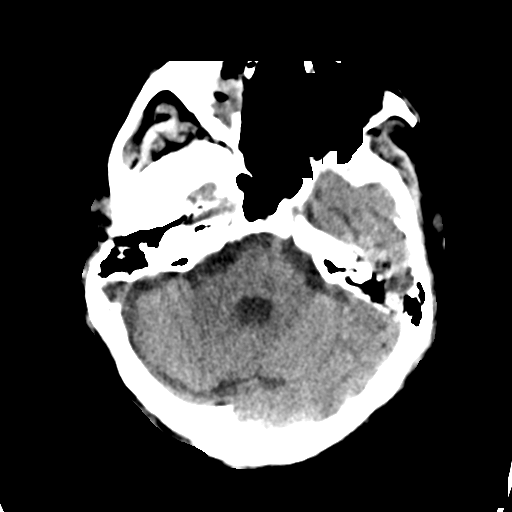
[im 13/33  brain]
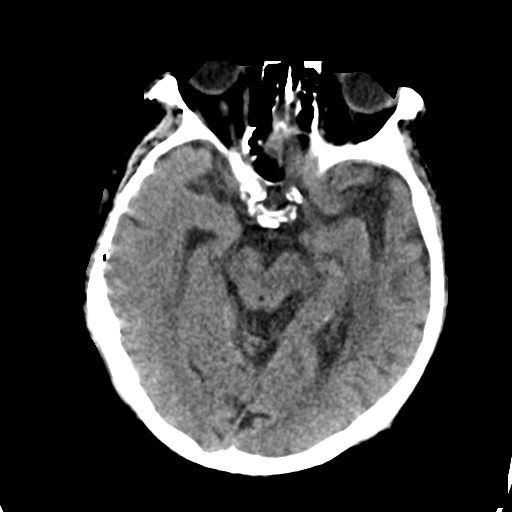
[im 17/33  brain]
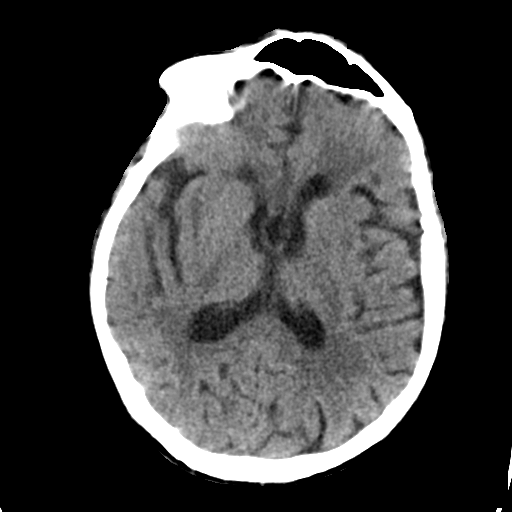
[im 21/33  brain]
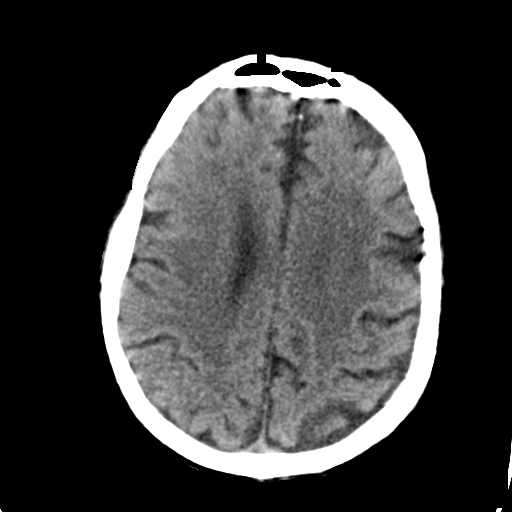
[im 21/33  bone]
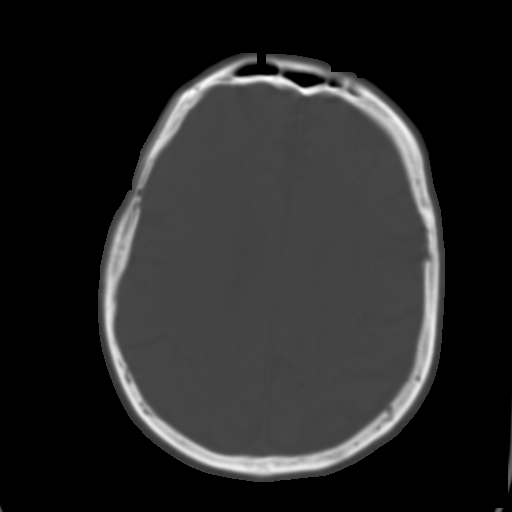
[im 25/33  brain]
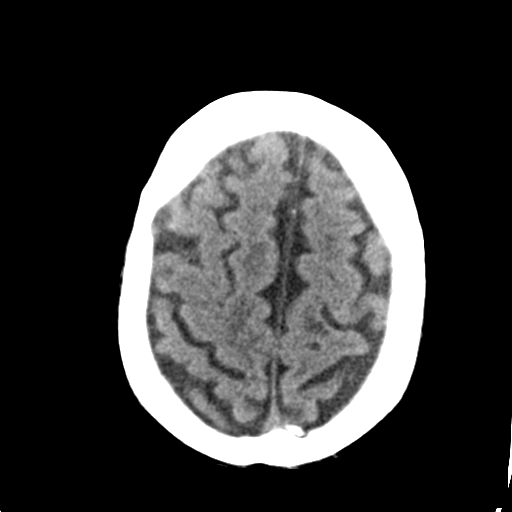
[im 29/33  brain]
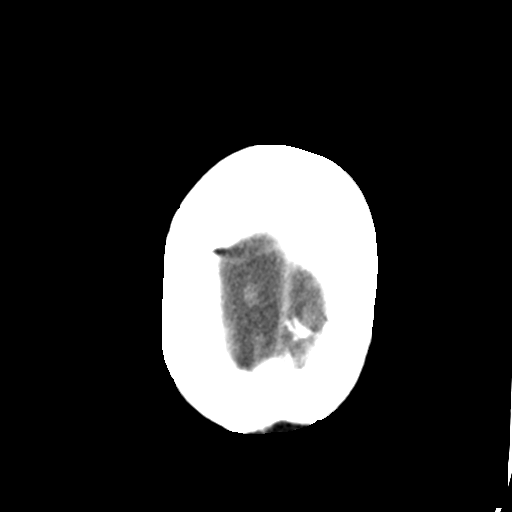

[Series 4: head bone · axial · 0.42mm/px · z∈[-82,-50]mm · 3 of 83 slices shown]
[im 9/83  bone]
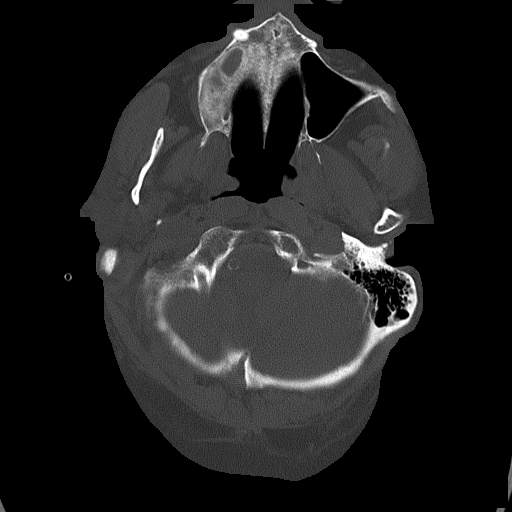
[im 17/83  bone]
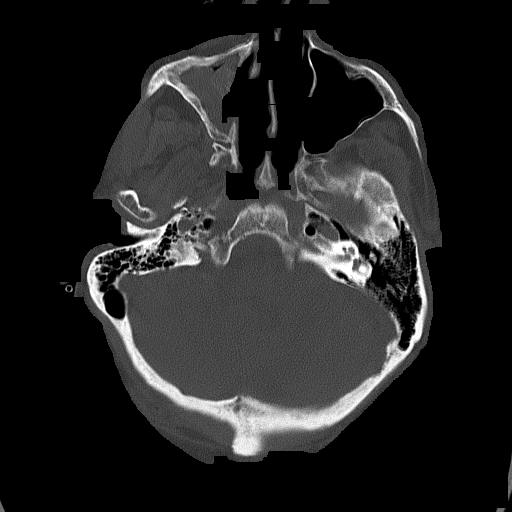
[im 25/83  bone]
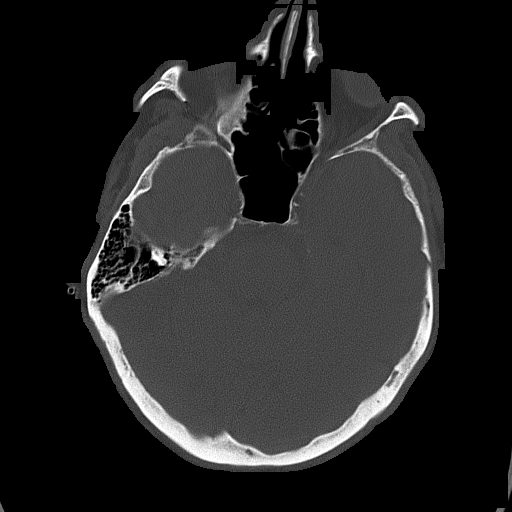

[Series 5: head without cor · coronal · non-contrast · 0.32mm/px · 3 of 67 slices shown]
[im 23/67  brain]
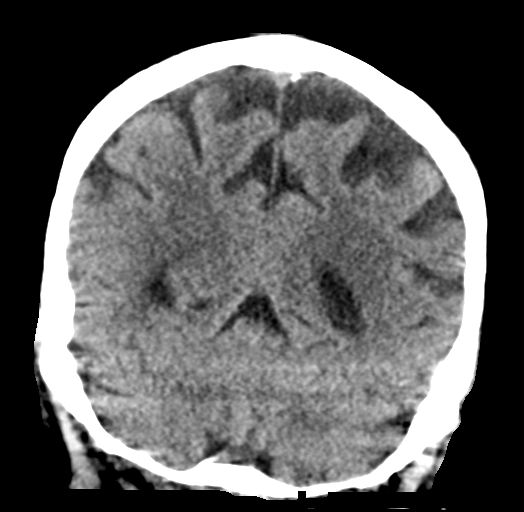
[im 30/67  brain]
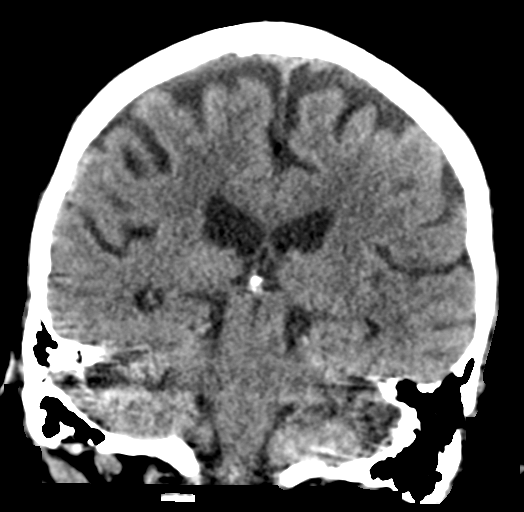
[im 37/67  brain]
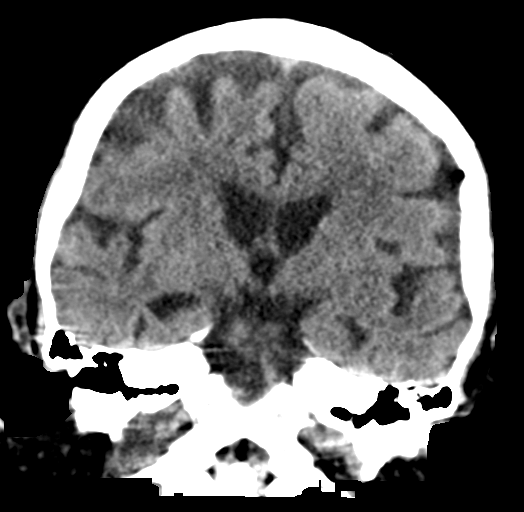

[Series 6: head without sag · sagittal · non-contrast · 0.32mm/px · 3 of 67 slices shown]
[im 23/67  brain]
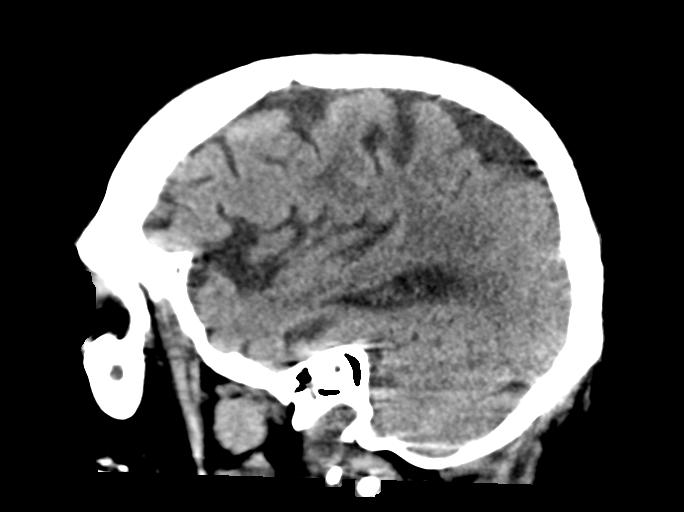
[im 34/67  brain]
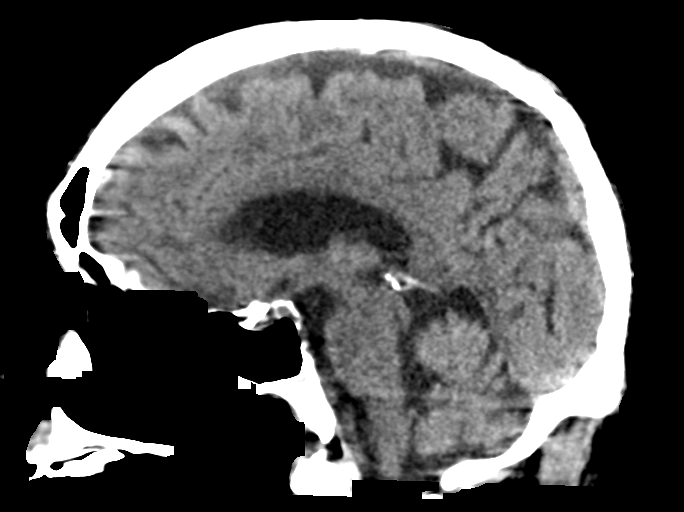
[im 45/67  brain]
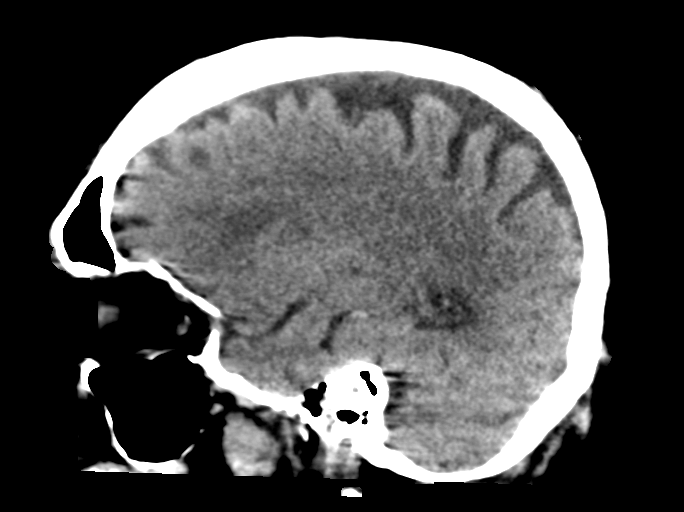

[16 of 47 positions shown; findings below may reference images not displayed]

FINDINGS: Brain: Exam is motion degraded. No intracranial hemorrhage. Probable
remote infarct left lenticular nucleus/caudate. Left thalamic
infarct of indeterminate age. Chronic microvascular changes. No
intracranial mass lesion noted on this unenhanced exam. Global
atrophy

Vascular: Vascular calcifications.  No acute hyperdense vessel.

Skull: No skull fracture.

Sinuses/Orbits: No acute orbital abnormality. Opacified right
maxillary sinus with central hyperdense material suggesting chronic
changes with inspissated material. Adjacent bone thickening also
consistent with chronic sinusitis.

Other: Mastoid air cells and middle ear cavities are clear.
IMPRESSION: 1. Exam is motion degraded.
2. No intracranial hemorrhage.
3. Probable remote infarct left lenticular nucleus/caudate.
4. Left thalamic infarct of indeterminate age.
5. Chronic microvascular changes.
6. Global atrophy.
7. Chronic right maxillary sinusitis.

## 2019-04-12 IMAGING — CT CT ABD-PELV W/O CM
2 of 4 series · 16 of 46 positions shown, 18 images · non-contrast
Comparison: None.

CLINICAL DATA: Confusion, jaundice.  Abdominal pain.

EXAM:
CT ABDOMEN AND PELVIS WITHOUT CONTRAST
TECHNIQUE: Multidetector CT imaging of the abdomen and pelvis was performed
following the standard protocol without IV contrast.

[Series 9: a/p w/o 5mm · axial · non-contrast · 0.76mm/px · z∈[-791,-316]mm · 13 of 105 slices shown, 15 images]
[im 5/105  soft-tissue]
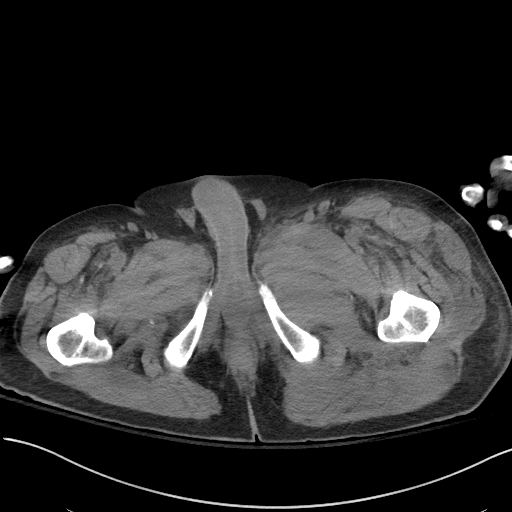
[im 5/105  bone]
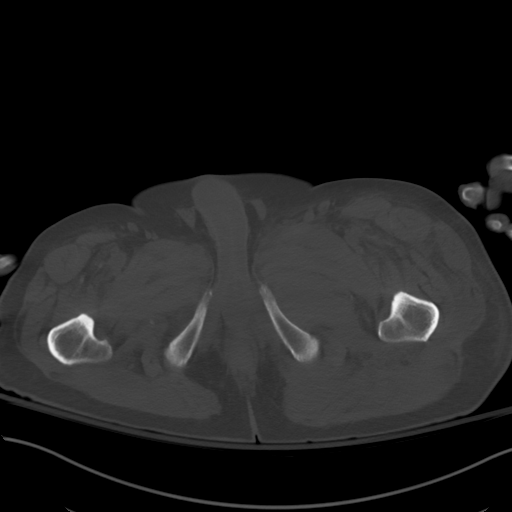
[im 14/105  soft-tissue]
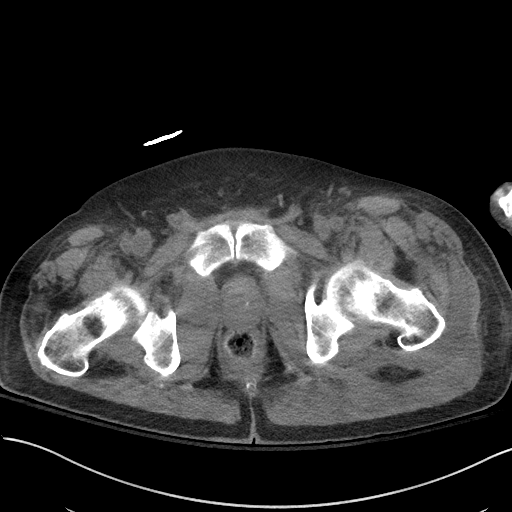
[im 22/105  soft-tissue]
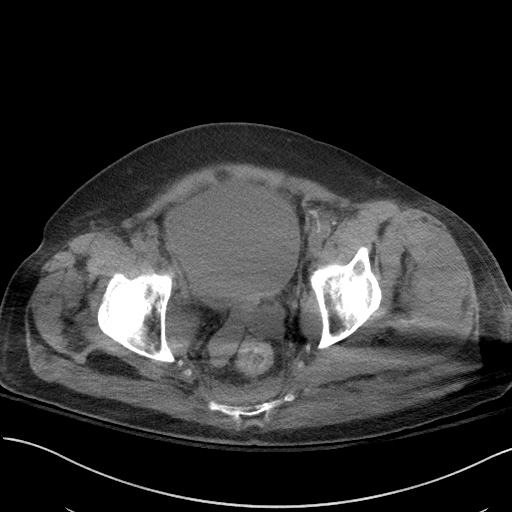
[im 31/105  soft-tissue]
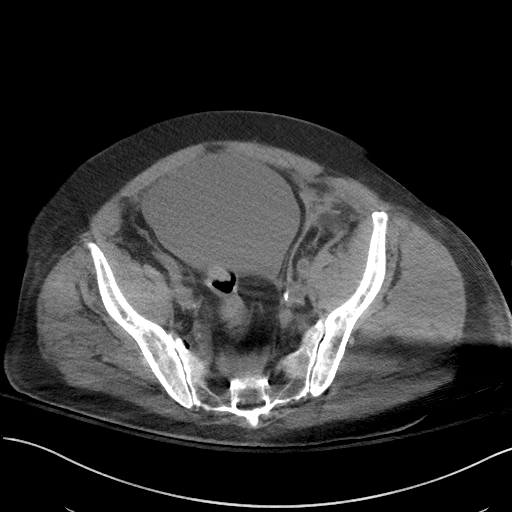
[im 35/105  soft-tissue]
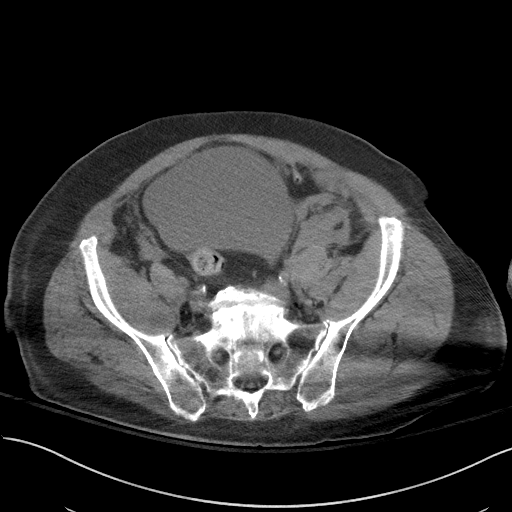
[im 44/105  soft-tissue]
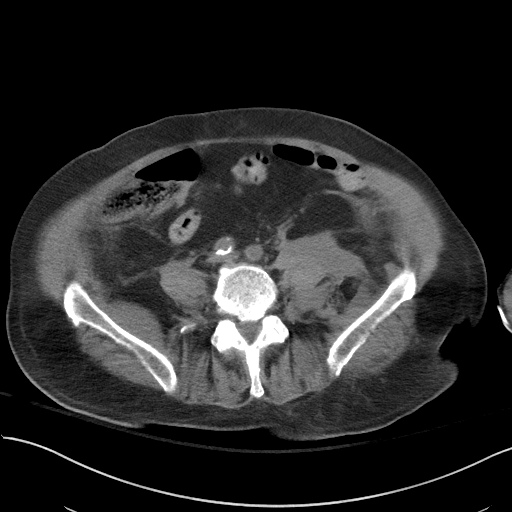
[im 53/105  soft-tissue]
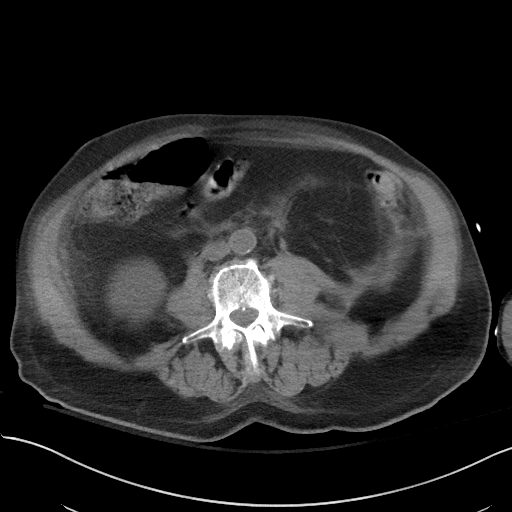
[im 61/105  soft-tissue]
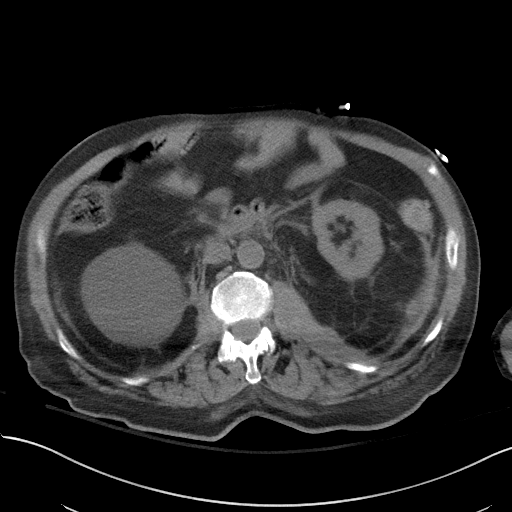
[im 70/105  soft-tissue]
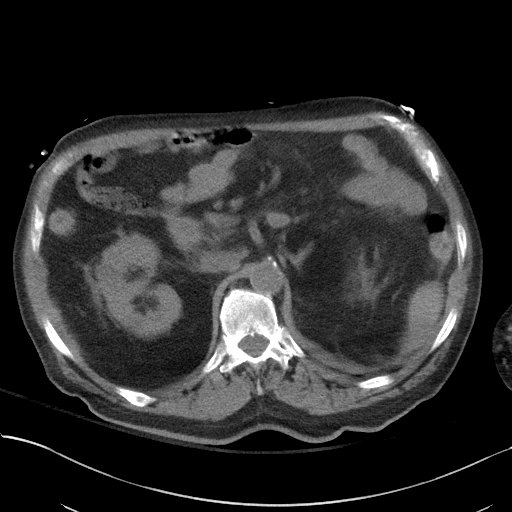
[im 70/105  bone]
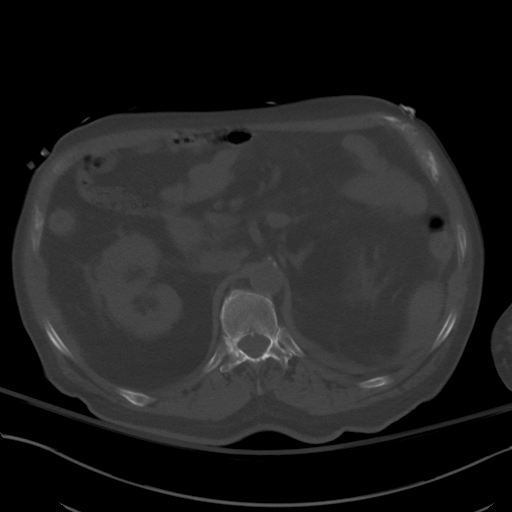
[im 74/105  soft-tissue]
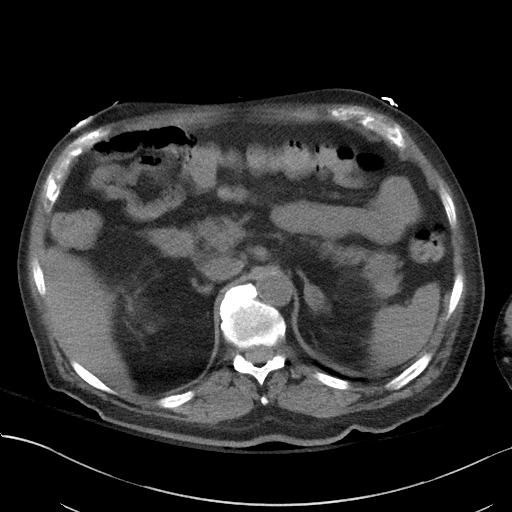
[im 83/105  soft-tissue]
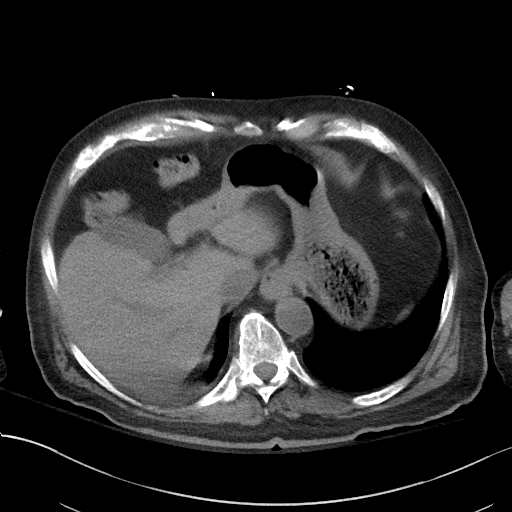
[im 92/105  soft-tissue]
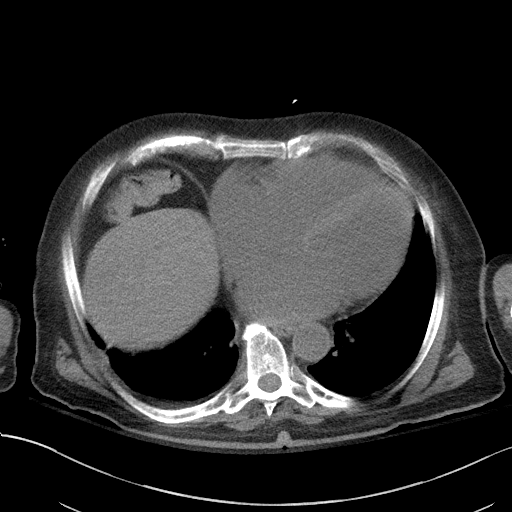
[im 100/105  soft-tissue]
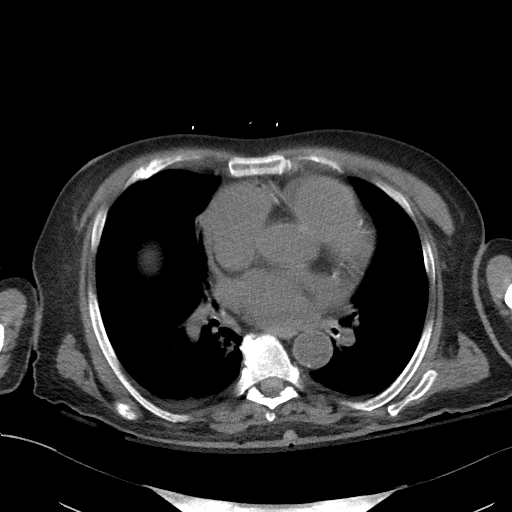

[Series 12: a/p w/o cor · coronal · non-contrast · 0.81mm/px · 3 of 151 slices shown]
[im 51/151  soft-tissue]
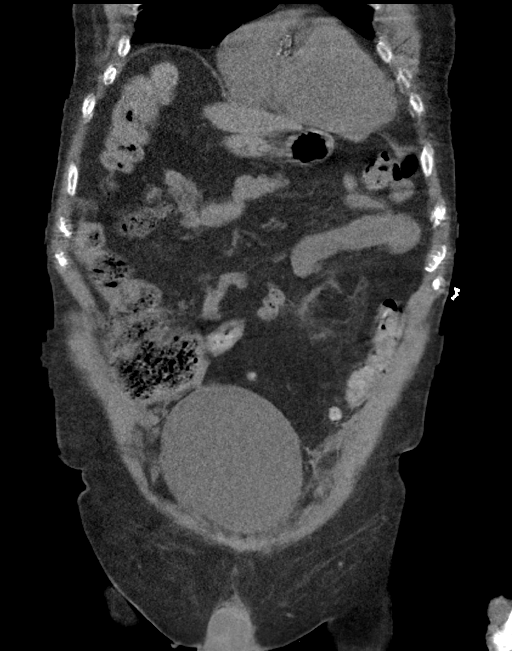
[im 67/151  soft-tissue]
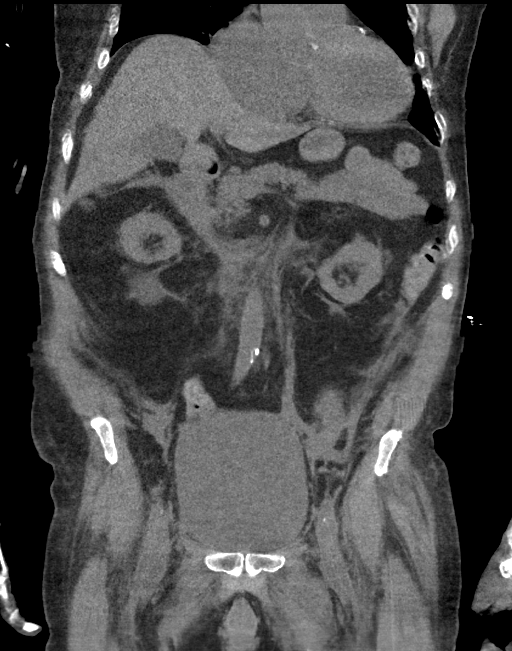
[im 84/151  soft-tissue]
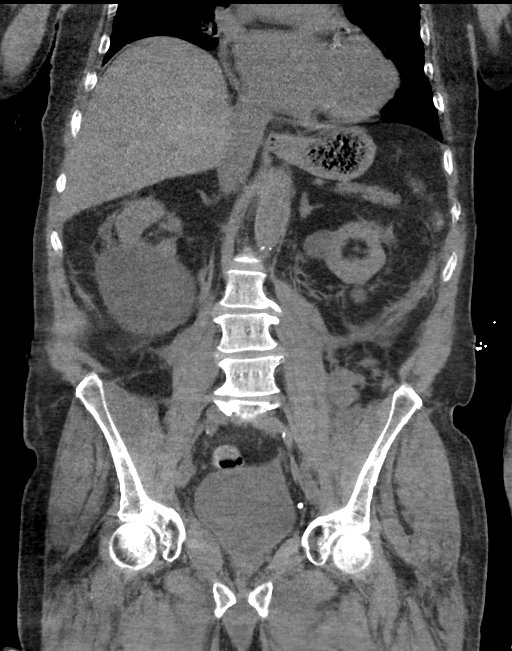

[16 of 46 positions shown; findings below may reference images not displayed]

FINDINGS: Lower chest: Cardiomegaly. Small right pleural effusion. No
confluent opacities.

Hepatobiliary: No focal hepatic abnormality. Gallbladder
unremarkable.

Pancreas: No focal abnormality or ductal dilatation.

Spleen: No focal abnormality.  Normal size.

Adrenals/Urinary Tract: Small nodule in the left adrenal gland
measures 18 mm and measures 11 Hounsfield units, most compatible
with adenoma. Extensive perinephric stranding bilaterally. Large
cyst off the lower pole of the right kidney measures up to 8.1 cm.
Small cysts in the left kidney. No hydronephrosis. Urinary bladder
unremarkable.

Stomach/Bowel: Appendix is normal. Sigmoid diverticulosis. No active
diverticulitis. Stomach and small bowel decompressed, unremarkable.

Vascular/Lymphatic: Aortic and iliac calcifications. No aneurysm or
adenopathy.

Reproductive: No visible focal abnormality.

Other: Extensive stranding noted in the retroperitoneum bilaterally,
left greater than right most compatible with spontaneous
retroperitoneal hematoma. The largest dimensions on axial imaging is
in the left retroperitoneum measuring 5.8 x 3.8 cm on image 64.
Small amount of free fluid in the cul-de-sac.

Musculoskeletal: No acute bony abnormality. Degenerative disc and
facet disease in the lumbar spine.
IMPRESSION: Extensive stranding in the retroperitoneum bilaterally including
along the psoas and iliopsoas muscles and in the perinephric space,
left greater than right. This is larger in the left retroperitoneum
and most compatible with spontaneous retroperitoneal hematomas.

Sigmoid diverticulosis.

Aortic atherosclerosis.

Trace right pleural effusion.

Small amount of free fluid in the pelvis.
